# Patient Record
Sex: Female | Born: 1980 | Race: White | Hispanic: No | Marital: Married | Smoking: Never smoker
Health system: Southern US, Community
[De-identification: ages and names within clinical notes are randomized; demographics above are authoritative.]

## PROBLEM LIST (undated history)

## (undated) DIAGNOSIS — C801 Malignant (primary) neoplasm, unspecified: Secondary | ICD-10-CM

## (undated) DIAGNOSIS — R519 Headache, unspecified: Secondary | ICD-10-CM

## (undated) DIAGNOSIS — K219 Gastro-esophageal reflux disease without esophagitis: Secondary | ICD-10-CM

## (undated) DIAGNOSIS — D649 Anemia, unspecified: Secondary | ICD-10-CM

## (undated) HISTORY — PX: THYROIDECTOMY: SHX17

---
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
## 2001-01-11 ENCOUNTER — Other Ambulatory Visit: Admission: RE | Admit: 2001-01-11 | Discharge: 2001-01-11 | Payer: Self-pay | Admitting: Obstetrics and Gynecology

## 2002-01-22 ENCOUNTER — Other Ambulatory Visit: Admission: RE | Admit: 2002-01-22 | Discharge: 2002-01-22 | Payer: Self-pay | Admitting: Obstetrics and Gynecology

## 2003-06-30 ENCOUNTER — Inpatient Hospital Stay (HOSPITAL_COMMUNITY): Admission: AD | Admit: 2003-06-30 | Discharge: 2003-07-03 | Payer: Self-pay | Admitting: Obstetrics and Gynecology

## 2003-08-04 ENCOUNTER — Other Ambulatory Visit: Admission: RE | Admit: 2003-08-04 | Discharge: 2003-08-04 | Payer: Self-pay | Admitting: Obstetrics and Gynecology

## 2004-08-05 ENCOUNTER — Other Ambulatory Visit: Admission: RE | Admit: 2004-08-05 | Discharge: 2004-08-05 | Payer: Self-pay | Admitting: Obstetrics and Gynecology

## 2005-04-29 ENCOUNTER — Encounter (INDEPENDENT_AMBULATORY_CARE_PROVIDER_SITE_OTHER): Payer: Self-pay | Admitting: *Deleted

## 2005-04-29 ENCOUNTER — Inpatient Hospital Stay (HOSPITAL_COMMUNITY): Admission: AD | Admit: 2005-04-29 | Discharge: 2005-05-03 | Payer: Self-pay | Admitting: Obstetrics & Gynecology

## 2006-12-13 ENCOUNTER — Inpatient Hospital Stay (HOSPITAL_COMMUNITY): Admission: AD | Admit: 2006-12-13 | Discharge: 2006-12-13 | Payer: Self-pay | Admitting: Obstetrics and Gynecology

## 2006-12-14 ENCOUNTER — Other Ambulatory Visit: Payer: Self-pay | Admitting: Obstetrics and Gynecology

## 2006-12-26 ENCOUNTER — Encounter (INDEPENDENT_AMBULATORY_CARE_PROVIDER_SITE_OTHER): Payer: Self-pay | Admitting: Obstetrics and Gynecology

## 2006-12-26 ENCOUNTER — Inpatient Hospital Stay (HOSPITAL_COMMUNITY): Admission: AD | Admit: 2006-12-26 | Discharge: 2006-12-30 | Payer: Self-pay | Admitting: Obstetrics and Gynecology

## 2009-05-30 ENCOUNTER — Ambulatory Visit: Payer: Self-pay | Admitting: Internal Medicine

## 2009-10-21 ENCOUNTER — Encounter: Admission: RE | Admit: 2009-10-21 | Discharge: 2009-10-22 | Payer: Self-pay | Admitting: Obstetrics and Gynecology

## 2009-12-17 ENCOUNTER — Inpatient Hospital Stay (HOSPITAL_COMMUNITY): Admission: AD | Admit: 2009-12-17 | Discharge: 2009-12-17 | Payer: Self-pay | Admitting: Obstetrics and Gynecology

## 2009-12-31 ENCOUNTER — Inpatient Hospital Stay (HOSPITAL_COMMUNITY): Admission: RE | Admit: 2009-12-31 | Discharge: 2010-01-03 | Payer: Self-pay | Admitting: Obstetrics and Gynecology

## 2009-12-31 ENCOUNTER — Encounter (INDEPENDENT_AMBULATORY_CARE_PROVIDER_SITE_OTHER): Payer: Self-pay | Admitting: Obstetrics and Gynecology

## 2010-03-17 ENCOUNTER — Emergency Department: Payer: Self-pay | Admitting: Emergency Medicine

## 2010-07-15 LAB — CBC
HCT: 26.9 % — ABNORMAL LOW (ref 36.0–46.0)
HCT: 37.8 % (ref 36.0–46.0)
MCH: 33.4 pg (ref 26.0–34.0)
MCHC: 34.7 g/dL (ref 30.0–36.0)
MCV: 96.1 fL (ref 78.0–100.0)
Platelets: 198 10*3/uL (ref 150–400)
RDW: 13.9 % (ref 11.5–15.5)
RDW: 14.3 % (ref 11.5–15.5)
WBC: 7.2 10*3/uL (ref 4.0–10.5)

## 2010-07-15 LAB — URINALYSIS, ROUTINE W REFLEX MICROSCOPIC
Hgb urine dipstick: NEGATIVE
Nitrite: NEGATIVE
Protein, ur: NEGATIVE mg/dL
Specific Gravity, Urine: >1.03 — ABNORMAL HIGH (ref 1.005–1.030)
Urobilinogen, UA: 1 mg/dL (ref 0.0–1.0)
pH: 6 (ref 5.0–8.0)

## 2010-07-15 LAB — GLUCOSE, CAPILLARY
Glucose-Capillary: 77 mg/dL (ref 70–99)
Glucose-Capillary: 84 mg/dL (ref 70–99)

## 2010-07-15 LAB — ABO/RH: ABO/RH(D): O POS

## 2010-07-15 LAB — SURGICAL PCR SCREEN: Staphylococcus aureus: NEGATIVE

## 2010-07-15 LAB — TYPE AND SCREEN

## 2010-07-15 LAB — RPR: RPR Ser Ql: NONREACTIVE

## 2010-09-14 NOTE — Op Note (Signed)
Melanie [name], Melanie [name]               ACCOUNT NO.:  [id number]   MEDICAL RECORD NO.:  [id number]          PATIENT TYPE:  INP   LOCATION:  9373                          FACILITY:  [facility]   PHYSICIAN:  Randye Lobo, M.D.   DATE OF BIRTH:  May 23, 1980   DATE OF PROCEDURE:  12/26/2006  DATE OF DISCHARGE:                               OPERATIVE REPORT   PREOPERATIVE DIAGNOSIS:  1. Intrauterine gestation at 36 plus 2 weeks.  2. Pregnancy induced hypertenstion, possible developing preeclampsia.  3. History of prior cesarean section, desires repeat cesarean section.   POSTOPERATIVE DIAGNOSIS:  1. Intrauterine gestation at 36 plus 2 weeks.  2. Pregnancy induced hypertension, possible developing preeclampsia.  3. Fetal macrosomia.  4. History of cesarean section, desires repeat cesarean section.   PROCEDURE:  Repeat low segment transverse cesarean section.   SURGEON:  Randye Lobo, M.D.   ASSISTANT:  Luvenia Redden, M.D.   ANESTHESIA:  Spinal.   IV FLUIDS:  2600 mL Ringer's lactate.   ESTIMATED BLOOD LOSS:  800 mL.   URINE OUTPUT:  450 mL.   COMPLICATIONS:  None.   INDICATIONS FOR PROCEDURE:  The patient is a 30 year old gravida 3, para  1-1-0-2, Caucasian female at 42 plus [redacted] weeks gestation by a 9 week  ultrasound, who presented to the maternity admissions area on December 26, 2006, with uterine contractions.  At the time, the patient was noted to  have an elevated blood pressure.  The patient's cervix was fingertip  dilated, long and thick.  The fetal heart rate tracing was reactive and  there were irritable contractions which were noted.  The patient did  have pre-eclamptic laboratory studies which were normal.   Of note, approximately ten days prior, the patient presented to the  office for routine antepartum visit and was noted to have elevated blood  pressure in the 100 to 150 range over the 90s.  At that time, the  patient did have evaluation of the elevated blood pressure  in the  maternity admissions unit and she was noted to have normal laboratory  serum studies and a reassuring fetal assessment.  A 24-hour urine  collection was begun and the patient was discharged to home and placed  on bedrest.  The patient had blood pressure which returned to normal as  an outpatient.  Her final 24-hour urine collection did return  documenting 150 mg of protein.  The proteinuria was not apparent on the  follow-up office visit.   The patient does have a history of a prior cesarean section for  pregnancy induced hypertension at [redacted] weeks along with a transverse lie.  She also has a history of prior shoulder dystocia with a vaginal  delivery.  The patient is scheduled for a repeat cesarean section on  September 12.   The patient was observed in the maternity admissions area.  Her blood  pressure improved significantly with her on bedrest but as she would  ambulate to use the restroom, the blood pressure was noted to increase.  The patient had last had something to drink at approximately  midnight.  A plan was, therefore, made to proceed with a repeat cesarean section in  the morning as the mother and fetus were stable.  A plan is made now to  proceed with a repeat low segment transverse cesarean section after  risks, benefits, and alternatives have been discussed.  The patient  understands that with a preterm delivery, there is a possible admission  of the newborn to the neonatal intensive care unit.  The patient and  husband choose to proceed.   FINDINGS:  A viable female was delivered at 11:01 a.m. with Apgars of 8  at 1 minute and 9 at 5 minutes.  The amniotic fluid was noted to be  clear.  The weight of the newborn is later noted to be 8 pounds 10  ounces.  The placenta is noted to be large and has a normal insertion of  a three vessel cord.  The uterus, tubes and ovaries were unremarkable.  There is no evidence of any adhesive disease noted in the pelvis.  The   newborn is requiring blow by O2 and is, therefore, being transferred to  the neonatal intensive care nursery.   DESCRIPTION OF PROCEDURE:  The patient was reidentified in the maternity  admissions area.  She was brought down to the operating room where a  spinal anesthetic was administered.  The patient did receive clindamycin  900 mg IV for antibiotic prophylaxis.  In the operating room, a spinal  anesthetic was administered.  The patient was placed in the left lateral  supine position.  The abdomen was sterilely prepped and a Foley catheter  was sterilely placed inside the bladder.  She was sterilely draped.   A Pfannenstiel incision was created sharply with a scalpel.  The  incision was carried down to the fascia using monopolar cautery for  hemostasis.  The fascia was incised in the midline and the incision was  extended bilaterally with Mayo scissors.  The rectus muscles were  dissected off of the fascia superiorly and inferiorly.  The rectus  muscles were then sharply divided in the midline.  The parietal  peritoneum was elevated with two hemostat clamps and was entered  sharply.  The peritoneal incision was extended cranially and caudally.   The lower uterine segment was exposed with the bladder retractor.  A  bladder flap was then sharply created.  A transverse lower uterine  segment incision was created sharply with a scalpel.  The uterine  incision was extended bilaterally in an upward fashion with bandage  scissors.  A hand was inserted through the uterine incision and the  vertex was delivered without difficulty followed by the remainder of the  newborn.  The nares and mouth were suctioned.  The cord was doubly  clamped and cut, and the newborn was carried over to the waiting  pediatricians.  The placenta was manually extracted and was sent for  cord blood donation and then sent to pathology for evaluation.   The uterus was exteriorized.  Remaining membranes were removed  from  within the uterine cavity using a ring forceps.  The uterus was then  wiped clean and no remaining products of conception were noted.  The  uterine incision was closed with a double layer closure of #1 chromic.  The first layer was a running locked layer and the second was an  imbricating layer.  The uterus was returned to the peritoneal cavity at  this time which was irrigated and suctioned.  There was a  small amount  of bleeding noted in the mid portion of the uterine incision and this  was sutured with a figure-of-eight suture of 3-0 Vicryl to create good  hemostasis.   The abdomen was closed at this time.  The parietal peritoneum was closed  with a running suture of 3-0 Vicryl.  The rectus muscles were  reapproximated in the midline with interrupted sutures of #1 chromic.  The fascia was closed with a running suture of 0 Vicryl.  The  subcutaneous layer was irrigated and suctioned and made hemostatic with  monopolar cautery.  The skin was closed with staples.  A sterile bandage  was placed over this.  This concluded the patient's procedure.  There  were no complications.  All needle, instrument, and sponge counts were  correct.   DISPOSITION:  The patient is escorted to the recovery room in stable and  awake condition.  Due to elevated blood pressure immediately prior to  the cesarean section and during the procedure, the patient will be  admitted to the adult intensive care unit for magnesium sulfate seizure  prophylaxis postpartum.      Randye Lobo, M.D.  Electronically Signed     BES/MEDQ  D:  12/26/2006  T:  12/26/2006  Job:  [id number]

## 2010-09-17 NOTE — Op Note (Signed)
[name], [name]               ACCOUNT NO.:  [id number]   MEDICAL RECORD NO.:  [id number]          PATIENT TYPE:  INP   LOCATION:  9113                          FACILITY:  [facility]   PHYSICIAN:  Gerrit Friends. Aldona Bar, M.D.   DATE OF BIRTH:  03-19-1981   DATE OF PROCEDURE:  04/29/2005  DATE OF DISCHARGE:                                 OPERATIVE REPORT   PREOPERATIVE DIAGNOSES:  A 35 to 36-week intrauterine pregnancy,  preeclampsia, transverse lie, size much greater than dates.   POSTOPERATIVE DIAGNOSES:  A 35 to 36-week intrauterine pregnancy,  preeclampsia, transverse lie, size much greater than dates.  Delivery of 8  pounds 13 ounces female infant, Apgars 9 and 9.   PROCEDURE:  Primary low transverse cesarean section.   SURGEON:  Dr. Aldona Bar   ANESTHESIA:  Spinal, Dr. Pamalee Leyden.   HISTORY:  This 30 year old gravida 2, para 1 was seen by me in the office 2  days ago and was doing well at about 35-1/2 weeks' gestation.  It was  realized about 3-4 weeks ago that size was much greater than dates, and an  ultrasound done in our office December 12 showed the baby was at least 7-1/2  pounds at that point in time - 33-[redacted] weeks gestation.  As mentioned, when I  saw her in the office on December 27, she was stable.  She called the office  on the morning of December 29, reporting increased pressure and just was not  feeling well.  Evaluation in the office found her blood pressure to be  elevated.  She was swollen.  She had gained 4 pounds in the past 2 days, and  she was sent to maternity admissions for further evaluation.  In maternity  admissions, size was consistent with at least a term size infant.  Estimated  fetal weight was in my way of thinking in excess of 8 pounds.  The baby was  presenting to transverse lie.  Her blood pressure was 150/100.  All her labs  were within normal limits except for a uric acid of 7.8 and on pelvic  examination, her cervix was fingertip, 20%, soft, but the  presenting part  was totally out the pelvis.  She also had 3+ hyperreflexia and 1 beat of  clonus bilaterally.   It was felt that she had preeclampsia and delivery was most likely  advisable, especially considering the size of the baby.  It is of note that  with the patient's first delivery at 37 weeks for which she was induced  because of preeclampsia, she delivered an 8 pound 10 ounce baby vaginally  after induction, but a shoulder dystocia was encountered at the time of  delivery, fortunately with no sequelae.   Because of the patient's transverse lie and her primary diagnosis of  preeclampsia, the decision was made to proceed with primary low transverse  cesarean section which was discussed in detail with the patient and her  husband.   PROCEDURE:  The patient was taken to the operating where after satisfactory  induction of spinal anesthetic, she was prepped and draped in  the usual  fashion, having been placed in the supine position and slightly tilted to  the left.  A Foley catheter was inserted as part of the prep.   Once the patient was adequately draped and good anesthetic levels were  documented, operative procedure was begun.  A Pfannenstiel incision was made  and without difficulty dissected down sharply to and through the fascia in a  low transverse fashion with hemostasis created at each layer.  The  subfascial space was created inferiorly and superiorly, muscles separated in  the midline, peritoneum identified and entered appropriately with care taken  to avoid the bowel superiorly and the bladder inferiorly.  At this time, the  bladder blade was placed, and the vesicouterine peritoneum was incised in a  low transverse fashion and pushed off the lower uterine segment with ease.  Baby was still presenting to transverse lie.   At this time, a low transverse incision was made in the uterus with  Metzenbaum scissors and extended laterally with the bandage scissors.   Amniotomy was produced with reduction of clear fluid.  At this time, it was  ascertained the baby was a backup transverse lie with the head in the  patient's right lower quadrant and with minimal difficulty the head was  rotated into the pelvis and the baby was delivered as a vertex.  The infant  cried spontaneously once and after the cord was clamped and cut, the infant  was passed off to the awaiting team and subsequently taken to the nursery in  good condition.  Ultimate weight was noted to be 8 pounds 13 ounces.  Baby  was female, and Apgars were 9 and 9.   After the cord bloods were collected, the placenta was delivered intact.  The placenta was sent to pathology appropriately labeled.   The uterus at this time was exteriorized, rendered free of any remaining  products conception.  A small submucosal myoma was removed from  the left  lateral side of the uterus with blunt and sharp dissection.  It measured  approximately 2 cm.   With good uterine contractility afforded with slowly given intravenous  Pitocin and manual stimulation, the uterine incision was closed using a  layer of #1 Vicryl in a running locking fashion followed by a layer of #1  Vicryl in an imbricating fashion.  The bladder flap was not reapproximated.  The uterine incision was dry.   Tubes and ovaries appeared normal.  Uterus was well contracted and at this  time, after the abdomen was lavaged of all free blood and clot, the uterus  was re-placed into the abdominal cavity and after all counts were noted to  be correct and no foreign bodies were noted to be remaining in the abdominal  cavity, closure of the abdomen was begun in layers.  The abdominal  peritoneum was closed with 0 Vicryl in a running fashion and muscle secured  with same.  Subfascial space was rendered hemostatic, and the fascia was  reapproximated with 0 Vicryl from angle to midline bilaterally.  The subcutaneous tissues were rendered hemostatic,  and staples were then used to  close the skin.  A sterile pressure dressing was applied.  The patient good  urine output during the procedure, at the conclusion of the procedure was  stable.  A sterile pressure dressing was applied, and she was taken to the  recovery room in satisfactory condition, having tolerated the procedure  well.  At conclusion of the procedure, both mother  and baby were doing well  in their respective recovery areas.   In summary, this patient presented at 51-36 weeks' gestation with  preeclampsia and a transverse lie and size much greater than dates.  Decision was made to proceed with delivery by primary low transverse  cesarean section primarily because of the patient's transverse lie, and she  was delivered of an 8 pound 13 ounce female infant with Apgars of 9 and 9 -  she was only 35-36 weeks' gestation.  Condition on arrival to recovery is  satisfactory.  All counts correct x2.      Gerrit Friends. Aldona Bar, M.D.  Electronically Signed     RMW/MEDQ  D:  04/29/2005  T:  04/30/2005  Job:  [id number]

## 2010-09-17 NOTE — Discharge Summary (Signed)
[name], Melanie [name]               ACCOUNT NO.:  [id number]   MEDICAL RECORD NO.:  [id number]          PATIENT TYPE:  INP   LOCATION:  9113                          FACILITY:  [facility]   PHYSICIAN:  Ilda Mori, M.D.   DATE OF BIRTH:  11/06/1980   DATE OF ADMISSION:  04/29/2005  DATE OF DISCHARGE:  05/03/2005                                 DISCHARGE SUMMARY   FINAL DIAGNOSES:  1.  Intrauterine gestation at 35-36 weeks.  2.  Preeclampsia.  3.  Transverse lie.  4.  Size much greater than dates.   PROCEDURE:  Primary low transverse cesarean section.  Surgeon:  Gerrit Friends.  Aldona Bar, M.D.  Complications:  None.   This 30 year old G2, P35, was seen in the office on December 29 complaining  of increased pressure and is not feeling well.  Evaluation in the office  found her blood pressure to be elevated.  She gained four pounds in the last  two days and then was sent to maternity admissions for further evaluation.  The patient's course was noticed to be about the size greater than dates  within the last couple of weeks, and an ultrasound showed that the baby was  probably about 7-1/2 pounds at this point.  The patient's antepartum course  up to this point had been uncomplicated.  She passed her one-hour glucose  tolerance test and did not start to develop these elevated blood pressures  until this admission.  The baby presented in a transverse lie.  Blood  pressure was up to about 150/100 on admission.  All her labs were within  normal limits except for uric acid of 7.8, and was just still fingertip  dilated, only 20% effaced.  She also had 3+ hyperreflexia and one beat of  clonus bilaterally.  At this point, because of the preeclampsia and being  far from delivery and size greater than dates, a decision was made to  proceed with cesarean section.  The patient was taken to the operating room  on April 29, 2005, by Dr. Annamaria Helling, where a primary low transverse  cesarean section was  performed with the delivery of an 8 pound 13 ounce  female infant with Apgars of 9 and 9.  Delivery went without complications.  The patient's postoperative course was uncomplicated.  Her blood pressures  started going down postoperatively and she was felt ready for discharge on  postoperative day #4.  On postoperative day #4 she was ready for discharge  on the first but was in the hospital with infant secondary sent to some  neonatal jaundice, and therefore is being discharged on the second.  She was  sent home on a regular diet, told to decrease activities, told to continue  her prenatal vitamins, was given Tylox one to two ever four hours as needed  for pain.  Told she could use an over-the-counter ibuprofen up to 600 mg  every six hours as needed for pain.  Was to follow up in the office in four  weeks.   DISCHARGE LABORATORY DATA:  Hemoglobin of 9.5, white blood cell count of  10.1, and platelets of 202,000.      Leilani Able, P.A.-C.      Ilda Mori, M.D.  Electronically Signed    MB/MEDQ  D:  05/19/2005  T:  05/19/2005  Job:  [id number]

## 2010-09-17 NOTE — Discharge Summary (Signed)
Melanie [name], Melanie [name]               ACCOUNT NO.:  [id number]   MEDICAL RECORD NO.:  [id number]          PATIENT TYPE:  INP   LOCATION:  9319                          FACILITY:  [facility]   PHYSICIAN:  Carrington Clamp, M.D. DATE OF BIRTH:  January 08, 1981   DATE OF ADMISSION:  12/26/2006  DATE OF DISCHARGE:  12/30/2006                               DISCHARGE SUMMARY   FINAL DIAGNOSES:  1. Intrauterine pregnancy at 62 and two-sevenths weeks gestation.  2. Preeclampsia.  3. History of prior cesarean section.  The patient desires repeat      cesarean section.  4. Fetal macrosomia.   PROCEDURE:  Repeat low transverse cesarean section.  Surgeon:  Dr. Conley Simmonds.  Assistant:  Dr. Lodema Hong.  Complications:  None.   This 30 year old G3, P1-1-0-2 presents at 30 and two-sevenths weeks  gestation with uterine contractions.  Upon this time the patient was  noted to have some elevated blood pressures.  She was only a fingertip  dilated and the cervix was still long and thick.  Fetal heart rate  tracing was nice and reactive and the patient did have some routine pre-  eclamptic labs which came back normal.  The patient's antepartum course  up to this point had been complicated by a history of pregnancy-induced  hypertension with her past two pregnancies.  Also history of cesarean  section secondary to transverse presentation with her first pregnancy.  Otherwise, had been uncomplicated.  She did have a negative group B  strep culture obtained in our office at [redacted] weeks gestation.   As far as the patient's hospital course goes, the patient has had a  history of cesarean section and desires repeat cesarean section with  this pregnancy.  The patient was observed in the maternity admissions  area.  Blood pressure improved on bedrest, but when she ambulated it did  increase.  Therefore, discussion was held with the patient and decision  was made to proceed with a cesarean section at this point.  The  patient  was taken to the operating room on December 26, 2006, by Dr. Conley Simmonds  where a repeat low segment transverse cesarean section was performed  with the delivery of an 8-pound 10-ounce female infant with Apgars of 8  and 9.  Delivery went without complications.  The patient's  postoperative course was benign without any significant fevers.  She was  continued on magnesium sulfate for a total of 24 hours for prophylaxis.  Baby was in the NICU but stable.  The patient continued to do well and  her blood pressure started normalizing postoperatively.  By  postoperative day #3 the patient's blood pressures were still elevated  and she was started on some labetalol 100 mg twice daily.  She was kept  in-house for one more day just to watch her pressures, and was stable on  labetalol and felt ready for discharge.  She was sent home on a regular  diet, told to decrease activities, told to continue her prenatal  vitamins and her labetalol 100 mg twice daily.  She was given Percocet  one  to two every 4-6 hours as needed for her pain, told she could use  over-the-counter ibuprofen up to 600 mg every 6 hours as needed for  pain, was to follow up in our office in 1 week for a blood pressure  check.  Precautions and instructions were reviewed with the patient.   LABORATORIES ON DISCHARGE:  The patient had a hemoglobin of 10.6; white  blood cell count of 9.7; platelets of 159,000.      Leilani Able, P.A.-C.      Carrington Clamp, M.D.  Electronically Signed    MB/MEDQ  D:  02/09/2007  T:  02/10/2007  Job:  [id number]

## 2011-02-09 ENCOUNTER — Ambulatory Visit: Payer: Self-pay | Admitting: Unknown Physician Specialty

## 2011-02-11 LAB — COMPREHENSIVE METABOLIC PANEL
ALT: 11
AST: 22
Alkaline Phosphatase: 115
BUN: 4 — ABNORMAL LOW
Calcium: 7.4 — ABNORMAL LOW
Chloride: 107
Creatinine, Ser: 0.64
Creatinine, Ser: 0.77
Glucose, Bld: 92
Potassium: 3.9
Potassium: 4.6
Sodium: 137
Total Bilirubin: 0.3
Total Protein: 5.9 — ABNORMAL LOW

## 2011-02-11 LAB — URINALYSIS, ROUTINE W REFLEX MICROSCOPIC
Hgb urine dipstick: NEGATIVE
Protein, ur: NEGATIVE
Specific Gravity, Urine: 1.015
pH: 5.5

## 2011-02-11 LAB — CBC
HCT: 28.8 — ABNORMAL LOW
HCT: 30.4 — ABNORMAL LOW
Hemoglobin: 10.6 — ABNORMAL LOW
Hemoglobin: 11.1 — ABNORMAL LOW
Hemoglobin: 9.9 — ABNORMAL LOW
MCHC: 34.8
MCV: 85.9
Platelets: 158
RBC: 3.36 — ABNORMAL LOW
RBC: 3.55 — ABNORMAL LOW
RDW: 14.4 — ABNORMAL HIGH
WBC: 7.8
WBC: 9.1
WBC: 9.7

## 2011-02-11 LAB — URIC ACID
Uric Acid, Serum: 6.5
Uric Acid, Serum: 7

## 2011-02-11 LAB — LACTATE DEHYDROGENASE: LDH: 231

## 2011-02-11 LAB — MAGNESIUM: Magnesium: 4.8 — ABNORMAL HIGH

## 2011-02-14 LAB — COMPREHENSIVE METABOLIC PANEL
ALT: 10
AST: 18
CO2: 21
Calcium: 9
GFR calc Af Amer: >60
Sodium: 137
Total Protein: 5.7 — ABNORMAL LOW

## 2011-02-14 LAB — PROTEIN, URINE, 24 HOUR
Collection Interval-UPROT: 24
Protein, 24H Urine: 150 — ABNORMAL HIGH
Protein, Urine: 6

## 2011-02-14 LAB — CBC
MCHC: 34.3
RBC: 3.79 — ABNORMAL LOW
RDW: 13.7

## 2011-02-14 LAB — CREATININE CLEARANCE, URINE, 24 HOUR
Collection Interval-CRCL: 24
Urine Total Volume-CRCL: 2500

## 2011-04-05 ENCOUNTER — Ambulatory Visit: Payer: Self-pay | Admitting: Unknown Physician Specialty

## 2011-04-06 LAB — PATHOLOGY REPORT

## 2011-09-12 ENCOUNTER — Ambulatory Visit: Payer: Self-pay | Admitting: Unknown Physician Specialty

## 2011-09-20 ENCOUNTER — Ambulatory Visit: Payer: Self-pay | Admitting: Unknown Physician Specialty

## 2012-01-19 ENCOUNTER — Other Ambulatory Visit: Payer: Self-pay

## 2013-01-24 ENCOUNTER — Other Ambulatory Visit: Payer: Self-pay

## 2013-07-17 ENCOUNTER — Ambulatory Visit: Payer: Self-pay | Admitting: Physician Assistant

## 2013-07-26 ENCOUNTER — Ambulatory Visit: Payer: Self-pay | Admitting: Physician Assistant

## 2013-08-05 ENCOUNTER — Ambulatory Visit: Payer: Self-pay | Admitting: Oncology

## 2013-08-13 ENCOUNTER — Ambulatory Visit: Payer: Self-pay | Admitting: Unknown Physician Specialty

## 2013-08-13 LAB — CALCIUM: CALCIUM: 8.8 mg/dL (ref 8.5–10.1)

## 2013-08-14 LAB — CALCIUM
CALCIUM: 7.5 mg/dL — AB (ref 8.5–10.1)
CALCIUM: 7.3 mg/dL — AB (ref 8.5–10.1)
Calcium, Total: 7.4 mg/dL — ABNORMAL LOW (ref 8.5–10.1)

## 2013-08-15 LAB — CALCIUM
CALCIUM: 6.8 mg/dL — AB (ref 8.5–10.1)
Calcium, Total: 7.4 mg/dL — ABNORMAL LOW (ref 8.5–10.1)
Calcium, Total: 7.3 mg/dL — ABNORMAL LOW (ref 8.5–10.1)

## 2013-08-15 LAB — MAGNESIUM
MAGNESIUM: 1.8 mg/dL
Magnesium: 1.6 mg/dL — ABNORMAL LOW

## 2013-08-15 LAB — PATHOLOGY REPORT

## 2013-08-20 LAB — TSH: Thyroid Stimulating Horm: 31.9 u[IU]/mL — ABNORMAL HIGH

## 2013-08-20 LAB — CBC CANCER CENTER
BASOS PCT: 1.3 %
Basophil #: 0.1 x10 3/mm (ref 0.0–0.1)
Eosinophil #: 0.7 x10 3/mm (ref 0.0–0.7)
Eosinophil %: 9.3 %
HCT: 38 % (ref 35.0–47.0)
HGB: 12.8 g/dL (ref 12.0–16.0)
Lymphocyte #: 2.6 x10 3/mm (ref 1.0–3.6)
Lymphocyte %: 32.4 %
MCH: 29.6 pg (ref 26.0–34.0)
MCHC: 33.7 g/dL (ref 32.0–36.0)
MCV: 88 fL (ref 80–100)
Monocyte #: 0.6 x10 3/mm (ref 0.2–0.9)
Monocyte %: 7.3 %
NEUTROS ABS: 4 x10 3/mm (ref 1.4–6.5)
Neutrophil %: 49.7 %
PLATELETS: 341 x10 3/mm (ref 150–440)
RBC: 4.33 10*6/uL (ref 3.80–5.20)
RDW: 13.2 % (ref 11.5–14.5)
WBC: 8 x10 3/mm (ref 3.6–11.0)

## 2013-08-30 ENCOUNTER — Ambulatory Visit: Payer: Self-pay | Admitting: Oncology

## 2013-09-02 LAB — TSH

## 2013-09-03 LAB — HCG, QUANTITATIVE, PREGNANCY: Beta Hcg, Quant.: <1 m[IU]/mL — ABNORMAL LOW

## 2013-09-30 ENCOUNTER — Ambulatory Visit: Payer: Self-pay | Admitting: Oncology

## 2014-01-29 ENCOUNTER — Other Ambulatory Visit: Payer: Self-pay

## 2014-01-30 LAB — CYTOLOGY - PAP

## 2014-08-18 ENCOUNTER — Ambulatory Visit
Admit: 2014-08-18 | Disposition: A | Discharge status: Home / Self Care | Payer: Self-pay | Attending: Internal Medicine | Admitting: Internal Medicine

## 2014-08-23 NOTE — Consult Note (Signed)
Allergies:  Sulfa drugs: Hives  Keflex: Hives  Penicillin: Hives  Tape: Rash  Assessment/Plan:  Assessment/Plan 34 yo F with papillary thyroid carcinoma s/p tota thyroidectomy on 4/14 with post-op hypocalcemia. Calcium in the 6.8 - 7.4 range post-op despite bid calcium+D. She was symptomatic overnight with extremity parethesias and was treated with 2g IV calcium gluconate.  She was interviewed, examined, and chart was reviewed.  A/ 1. 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 This may be temproary as there were no parathyroid glands appreciated on pathology. 2. Multifocal papillary thyroid carcinoma 3. Post-op hypothyroidism  P/ -Add calcitriol 0.5 mcg daily -Continue with current doses of calcium with D and magnesium -Cautioned her about s/s of hypocalcemia and appropriate treatment -Suspect she is safe for discharge per Dr. Tami Ribas. I will have her rtn for labs on Monday 4/20 including chem panel and magnesium and will then adjust her supplements and continue to follow, as needed.  Full consult has been dictated.   Case Discussed With patient, family   Electronic Signatures: Judi Cong (MD)  (Signed 16-Apr-15 14:02)  Authored: [facility], Assessment/Plan   Last Updated: 16-Apr-15 14:02 by Judi Cong (MD)

## 2014-08-23 NOTE — Op Note (Signed)
PATIENT NAME:  Melanie [name], ATKIN MR#:  [id number] DATE OF BIRTH:  10-May-1980  DATE OF PROCEDURE:  08/13/2013  DATE OF DISCHARGE: 08/13/2013   PREOPERATIVE DIAGNOSIS: Papillary carcinoma of the thyroid.  POSTOPERATIVE DIAGNOSIS: Papillary carcinoma of the thyroid.  ATTENDING SURGEON: Roena Malady, MD  ASSISTANT SURGEON: Huey Romans, MD  OPERATION PERFORMED: Total thyroidectomy, limited anterior neck dissection, laryngeal nerve monitoring for 2 hours.   OPERATIVE FINDINGS: Firm mass, left lobe of the thyroid, 1 small lymph node superior and anterior to the gland.   DESCRIPTION OF PROCEDURE: Juliyah was identified in the holding area and taken to the operating room and placed in supine position. After general endotracheal anesthesia with the laryngeal monitoring device on the endotracheal tube, which was visualized, the neck was gently extended. An incision line was marked in a natural skin crease overlying the cricoid cartilage. A local anesthetic of 1% lidocaine with 1:100,000 epinephrine was used to inject along the incision line. A total of 4 mL was used. With the neck prepped and draped sterilely, a 15-blade was used to incise down to and through the platysma muscle. Hemostasis was achieved using the Bovie cautery. Strap muscles were identified in the midline and gently retracted. Beginning on the left-hand side, the strap muscles were retracted laterally, and the left lobe of the gland was identified. This dissection took place over the lateral aspect of the gland, where there were multiple feeding vessels which were divided using the Harmonic scalpel. As the gland was retracted medially, several more feeding vessels were divided using the Harmonic. The superior pole was then isolated and divided using the Harmonic scalpel. As the gland was peeled medially, the superior and inferior parathyroid glands were identified and left intact with their vascular pedicles. The recurrent laryngeal nerve  was identified within the tracheoesophageal groove. This was identified and stimulated and left intact throughout the case. There was a firm, approximately 2 x 3 cm nodule in the left inferior aspect of the lobe which was retracted medially. In the superior aspect of the dissection, there was some fibrofatty tissue that contained at least 1 lymph node we could identify. This was dissected free from the surrounding tissue and sent as a separate specimen. As the gland was retracted medially, the tracheal wall was identified and was peeled over. The Berry's ligament was released, allowing the left lobe of the gland to medialize. With the left lobe dissected, attention was then turned to the right lobe, and in a similar fashion, the strap muscles were retracted laterally. There were multiple small feeding vessels which were divided using the Harmonic scalpel. The superior pole vessels were divided and allowed the superior aspect of the gland to come medially. The superior parathyroid gland was identified, dissected free and left on its vascular pedicle. The gland was retracted medially. The recurrent laryngeal nerve was identified in its groove, which was also identified, stimulated and left intact throughout the case. As the gland was retracted medially, the inferior parathyroid gland was identified and left on its vascular pedicle as the gland was retracted. Multiple feeding vessels were again divided using the Harmonic scalpel. Berry's ligament was released on the right, and the entire gland was peeled off the tracheal wall. Inspection of both sides of the neck again were dissected for any small lymph nodes. There was no other lymphadenopathy identified. The wound was then copiously irrigated with saline. Any small bleeding points were cauterized using the microbipolar. Surgicel was then placed in the neurovascular bed  at the end. Both nerves were stimulated and were intact at the end of the case. Two #10 TLS drains  were brought out of the wound inferiorly. Strap muscles were reapproximated using 4-0 Vicryl, he platysmal layer was closed using 4-0 Vicryl, and the skin was closed using Dermabond. A Tegaderm was used to hold the drains in place. The patient was then returned to anesthesia, where she was awakened in the operating room and taken to the recovery room in stable condition.   CULTURES: None.   SPECIMENS: Total thyroid and left anterior neck specimen.   ESTIMATED BLOOD LOSS: Less than 20 mL.    ____________________________ Roena Malady, MD ctm:lb D: 08/13/2013 09:17:11 ET T: 08/13/2013 09:31:21 ET JOB#: [id number]  cc: Roena Malady, MD, <Dictator> Roena Malady MD ELECTRONICALLY SIGNED 08/28/2013 8:26

## 2014-08-23 NOTE — Consult Note (Signed)
PATIENT NAME:  Melanie [name], Melanie [name] MR#:  [id number] DATE OF BIRTH:  July 18, 1980  ENDOCRINOLOGY CONSULTATION   DATE OF CONSULTATION:  08/15/2013  REFERRING PHYSICIAN:  Roena Malady, MD CONSULTING PHYSICIAN:  A. Lavone Orn, MD  CHIEF COMPLAINT: Hypocalcemia.   HISTORY OF PRESENT ILLNESS: This is a 34 year old female seen in consultation at the request of Dr. Beverly Gust for hypocalcemia. The patient has papillary thyroid cancer and underwent a total thyroidectomy on 08/13/2013. Pathology report indicates multifocal papillary thyroid carcinoma with 3 foci of tumor localized to the left lobe, measuring 17 mm, 5 mm and 2 mm. Background of Hashimoto's thyroiditis and nodular hyperplasia was present. One perithyroidal lymph node was negative. No parathyroid glands were seen. On postoperative day 1, her postoperative calcium level was 7.5, and repeat levels later that afternoon were 7.4 and 7.3. Overnight last night, she developed paresthesias in the extremities, and a calcium level at 2:00 a.m. was low at 6.8, and a repeat level today was 7.4. When she was symptomatic early this morning, she was given 2 grams calcium gluconate. On the evening of her operation, she was started on calcium carbonate 500 plus vitamin D 200 units 2 tabs b.i.d., and in addition, early today, when her magnesium was low at 1.7, magnesium oxide 800 mg per day was added. She reports the paresthesias have resolved. She denies any nausea, vomiting or abdominal pain. Appetite is fair. Denies dysphagia. No hoarseness. No cramping of the extremities. Regarding her thyroid cancer, preoperatively, she was seen by Dr. Oliva Bustard, oncology at [facility]. He recommended that she have I-131 ablation. She has not been started on levothyroxine, and as she understands it, the I-131 ablation will be performed sometime within about 4 weeks while she remains off levothyroxine.   PAST MEDICAL HISTORY: Papillary thyroid carcinoma, as per HPI.   PAST SURGICAL  HISTORY: Total thyroidectomy, 08/13/2013.   ALLERGIES: SULFA, KEFLEX AND PENICILLIN, ALL OF WHICH CAUSE HIVES.   FAMILY HISTORY: Positive for prostate cancer, heart disease and diabetes.   SOCIAL HISTORY: No tobacco or alcohol use.   CURRENT MEDICATIONS:  1. Calcium plus vitamin D 500/200 two tabs b.i.d.  2. Magnesium 800 mg daily.  3. Ciprofloxacin 500 mg q.12 hours.  4. Pantoprazole 40 mg daily.   REVIEW OF SYSTEMS:  GENERAL: No weight loss. No fever.  HEENT: Denies blurred vision. Denies sore throat.  NECK: Incisional discomfort has been treated with narcotics with good effect. Denies dysphagia.  CARDIAC: Denies chest pain. Denies palpitation.  PULMONARY: Denies cough. Denies shortness of breath.  ABDOMEN: Reports good appetite. No nausea.  EXTREMITIES: Denies leg swelling.  SKIN: Denies recent skin changes or rash.  HEMATOLOGIC: Denies easy bruisability or recent bleeding. ENDOCRINE: Denies heat or cold intolerance.   LABORATORY: As per HPI.   IMAGING: None available.   ASSESSMENT:  1. 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 As there was no parathyroid gland seen on the pathology report, this is thought to be due to a stunned parathyroid gland from surgery, and we are optimistic it will improve with time.  2. Postsurgical hypothyroidism.  3. Multifocal papillary thyroid carcinoma.   RECOMMENDATIONS:  1. Continue calcium plus vitamin D 2 tabs b.i.d. as ordered.  2. Continue magnesium 800 mg daily.  3. Add calcitriol 0.5 mcg once daily. I will have this given now.  4. Discussed recognition of signs and symptoms of hypo- and hypercalcemia. Advised she buy over-the-counter Tums and take 2 tabs if she ever has paresthesias, perioral or in  the extremities.  5. Plan to check a repeat MET-B Monday, 08/19/2013, at [facility] in [place]. She acknowledged understanding and will go to the [facility] around 8:00 a.m. Monday morning to have that done. I  plan to call her with that lab result and then taper the calcium and/or calcitriol, if needed. Plan to obtain also a magnesium level and taper the magnesium oxide if needed.  6. The patient to follow up with Dr. Oliva Bustard as planned to arrange I-131 ablation.   Thank you for the kind request for consultation.   ____________________________ A. Lavone Orn, MD ams:lb D: 08/15/2013 13:57:41 ET T: 08/15/2013 14:37:29 ET JOB#: [id number]  cc: A. Lavone Orn, MD, <Dictator> Sherlon Handing MD ELECTRONICALLY SIGNED 08/20/2013 17:32

## 2014-08-23 NOTE — Discharge Summary (Signed)
Dates of Admission and Diagnosis:  Date of Admission 13-Aug-2013   Date of Discharge 01-Jan-0001   Admitting Diagnosis Thyroid cancer   Final Diagnosis Same with hypocalcemia    Chief Complaint/History of Present Illness Thyroid Cancer   Allergies:  Sulfa drugs: Hives  Keflex: Hives  Penicillin: Hives  Tape: Rash  Pertinent Past History:  Pertinent Past History Neg   Hospital Course:  Hospital Course Admitted s/p thyroidectomy.  Hypocalcemia stablized.  DC to home on appropriate meds.   Condition on Discharge Good   Code Status:  Code Status Full Code   DISCHARGE INSTRUCTIONS HOME MEDS:  Medication Reconciliation: Patient's Home Medications at Discharge:     Medication Instructions  zyrtec 10 mg oral tablet  1 tab(s) orally once a day, As Needed   pataday 0.2% ophthalmic solution  1 drop(s) to each affected eye once a day, As Needed   calcium 600+d 600 mg-200 units oral tablet  1 tab(s) orally once a day-pt to start 08-06-13   acetaminophen-hydrocodone 325 mg-5 mg oral tablet  1 tab(s) orally every 3 to 4 hours, As needed, pain   calcitriol 0.5 mcg oral capsule  1 cap(s) orally once a day   magnesium oxide 400 mg (241.3 mg elemental magnesium) oral tablet  2 tab(s) orally once a day    STOP TAKING THE FOLLOWING MEDICATION(S):    zantac 150 150 mg oral tablet: 1 tab(s) orally once a day, As Needed anusol-hc 25 mg rectal suppository: 1 suppository(ies) rectal once a day, As Needed retin a micro gel 0.1% topical gel: Apply topically to affected area once a day ibuprofen 200 mg oral tablet: 1 tab(s) orally every 6 hours, As Needed  Physician's Instructions:  Diet Low iodine diet   Activity Limitations As tolerated   Return to Work 2 weeks   Time frame for Follow Up Appointment 1-2 weeks   Electronic Signatures: Roena Malady (MD)  (Signed 16-Apr-15 14:34)  Authored: ADMISSION DATE AND DIAGNOSIS, CHIEF COMPLAINT/HPI, Allergies, PERTINENT PAST HISTORY,  HOSPITAL COURSE, DISCHARGE INSTRUCTIONS HOME MEDS, PATIENT INSTRUCTIONS   Last Updated: 16-Apr-15 14:34 by Roena Malady (MD)

## 2016-01-09 IMAGING — US THYROID ULTRASOUND
1 series · 13 of 25 positions shown · non-contrast
Comparison: None.

CLINICAL DATA: Thyroid nodule

EXAM:
THYROID ULTRASOUND
TECHNIQUE: Ultrasound examination of the thyroid gland and adjacent soft
tissues was performed.

[Series 1: thyroid ultrasound · 0.08mm/px · 13 of 63 slices shown]
[im 1/63]
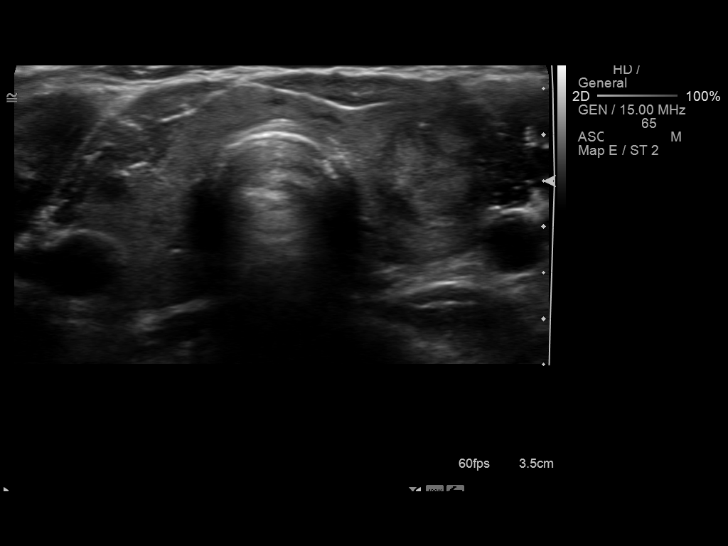
[im 6/63]
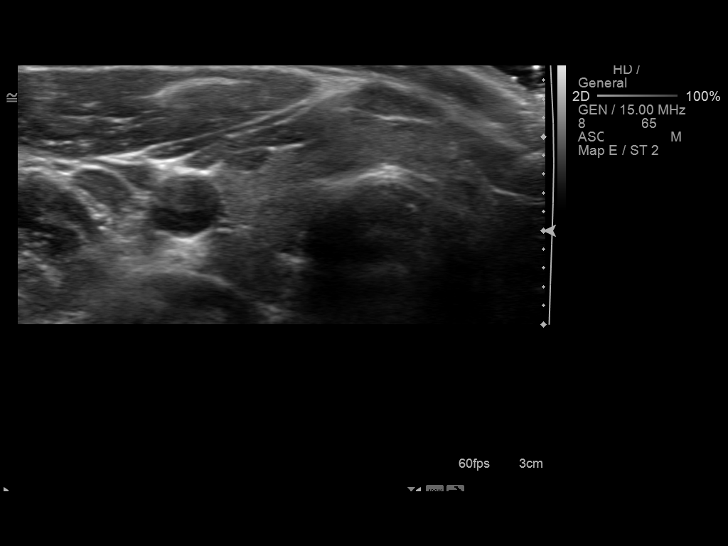
[im 11/63]
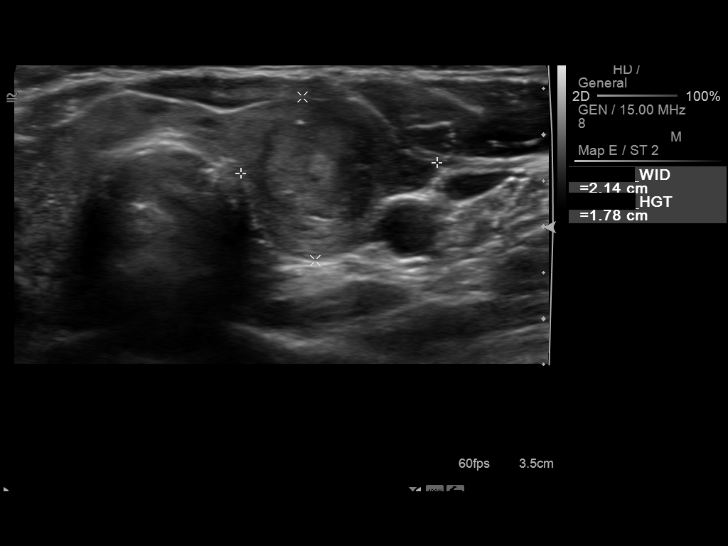
[im 16/63]
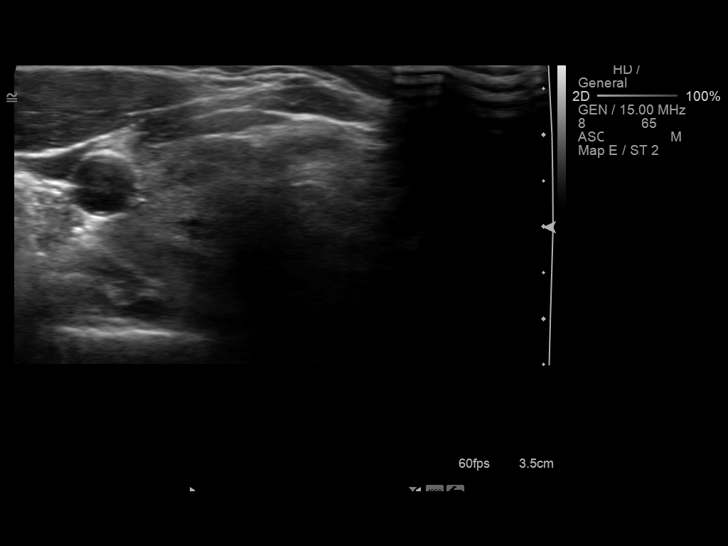
[im 21/63]
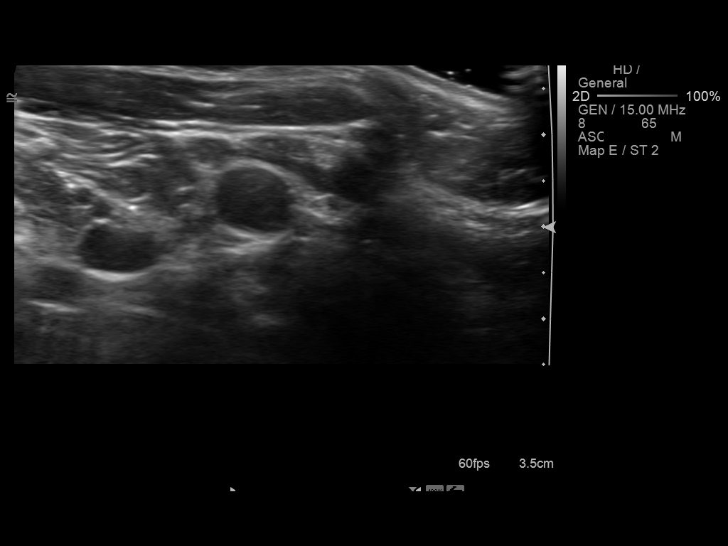
[im 26/63]
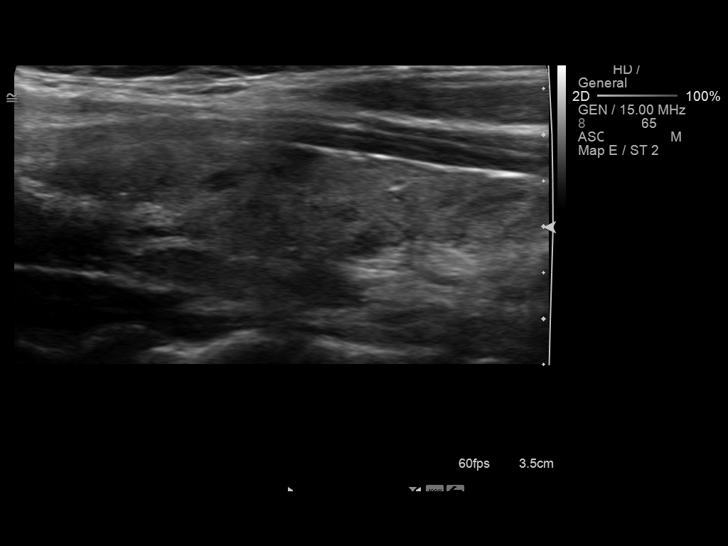
[im 32/63]
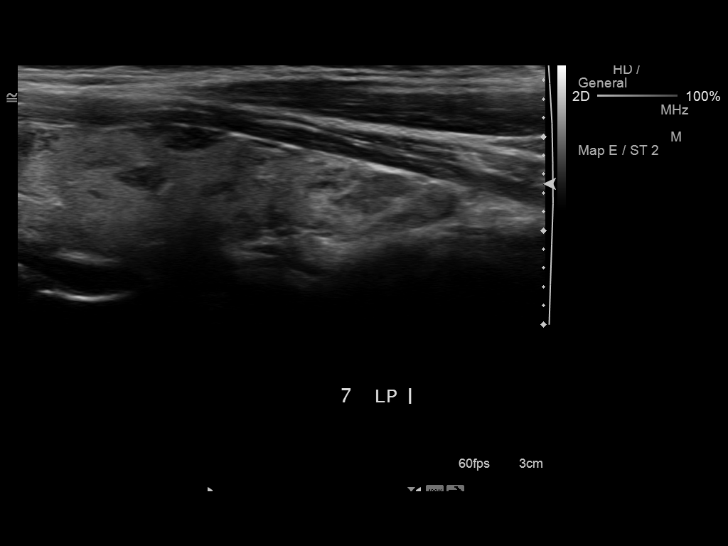
[im 37/63]
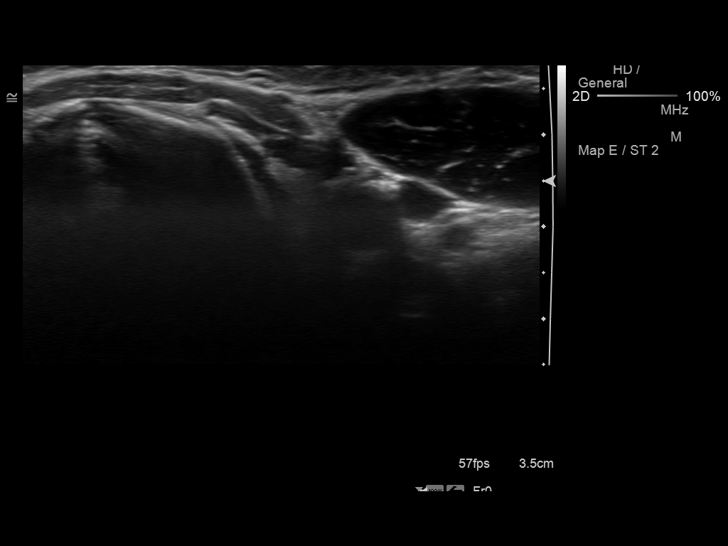
[im 42/63]
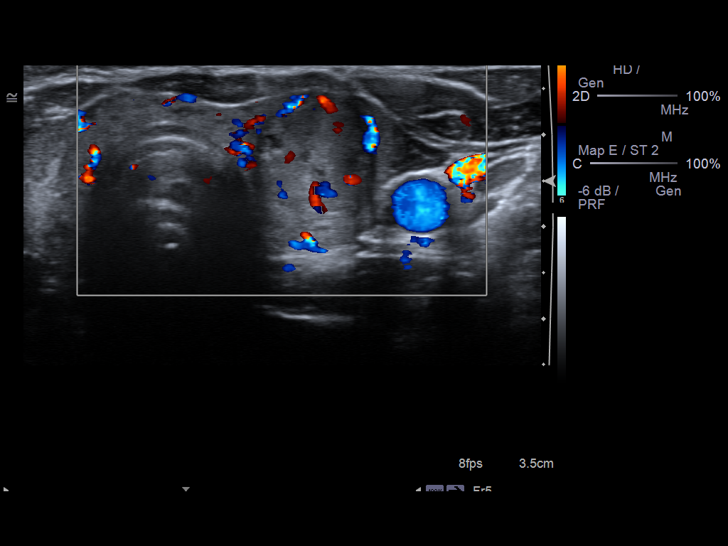
[im 47/63]
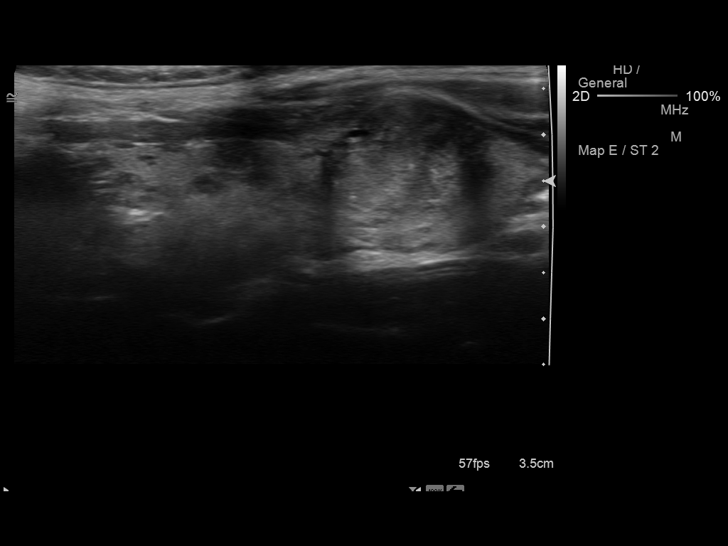
[im 52/63]
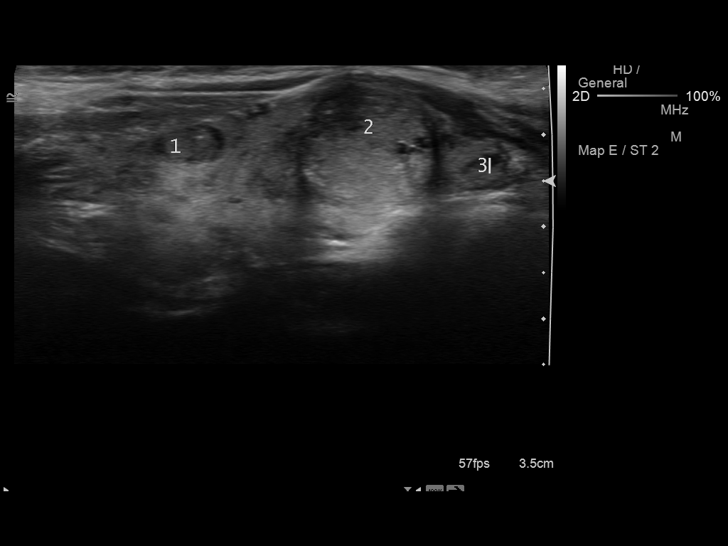
[im 57/63]
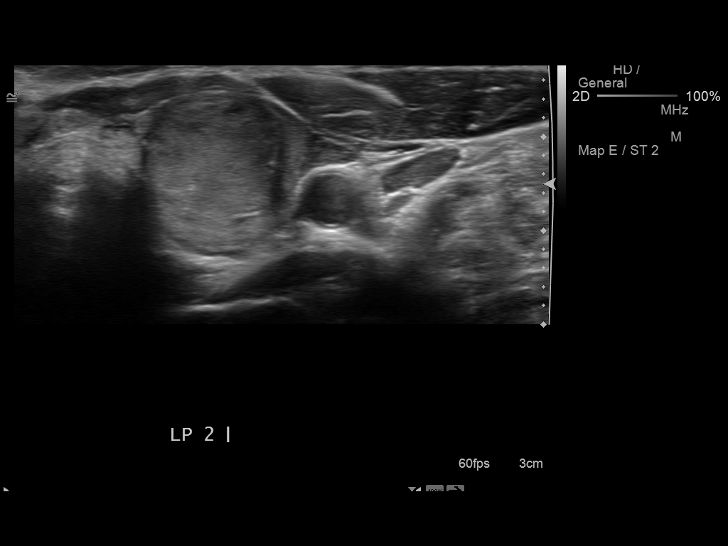
[im 63/63]
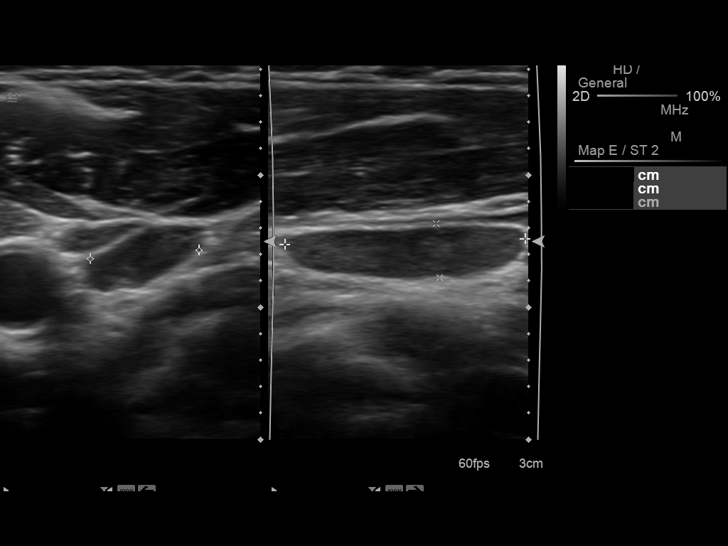

[13 of 25 positions shown; findings below may reference images not displayed]

FINDINGS: Right thyroid lobe

Measurements: 5.3 x 1.2 x 1.0 cm. Multiple small hypoechoic nodules,
most of which are less than a cm in size. There is an isoechoic
right lower pole nodule which measures 11 x 8 x 5 mm.

Left thyroid lobe

Measurements: 5.8 x 1.8 x 2.1 cm. Isoechoic solid nodule in the
midpole measures 2.0 x 1.8 x 1.6 cm. Focus of central
microcalcification. Adjacent 9 x 7 x 5 mm upper pole isoechoic
nodule.

Isthmus

Thickness: 5 mm.  No nodules visualized.

Lymphadenopathy

Elongated lymph nodes in the left neck which are not pathologically
enlarged based on size. Short axis diameter is approximately 4 mm.
IMPRESSION: Multiple bilateral thyroid nodules. The largest is in the left
midpole measuring up to 2 cm containing a focus of internal
microcalcification. Findings meet consensus criteria for biopsy.
Ultrasound-guided fine needle aspiration should be considered, as
per the consensus statement: Management of Thyroid Nodules Detected
at US: Society of Radiologists in Ultrasound Consensus Conference

## 2016-01-18 IMAGING — US ULTRASOUND CORE BIOPSY
1 series · 11 of 11 positions shown · non-contrast
Comparison: Thyroid Ultrasound - 07/17/2013

MEDICATIONS:
None

COMPLICATIONS:
None immediate

INDICATION: Indeterminate thyroid nodule

EXAM:
ULTRASOUND GUIDED FINE NEEDLE ASPIRATION OF INDETERMINATE THYROID
NODULE
TECHNIQUE: Informed written consent was obtained from the patient after a
discussion of the risks, benefits and alternatives to treatment.
Questions regarding the procedure were encouraged and answered. A
timeout was performed prior to the initiation of the procedure.

[Series 1: ultrasound core biopsy · 0.07mm/px · 11 acquisitions, 11 frames shown]
[im 1/11]
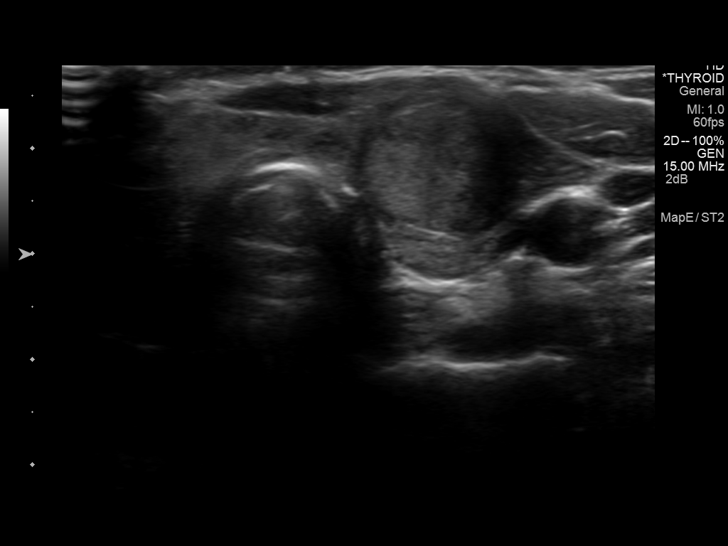
[im 2/11]
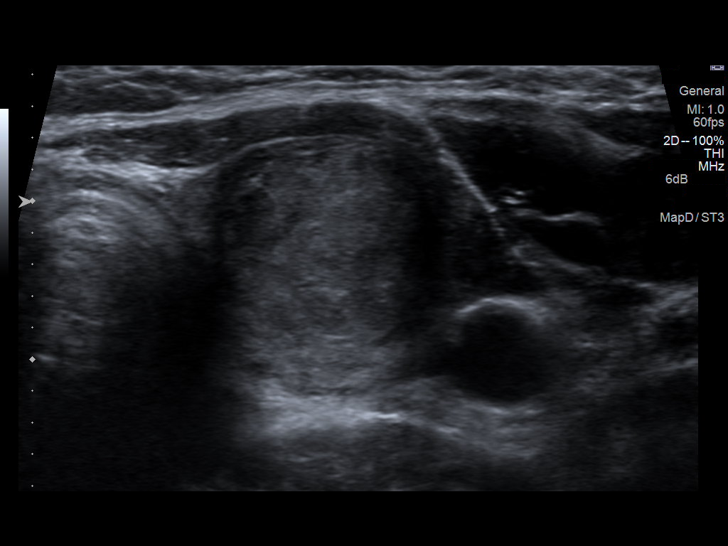
[im 3/11]
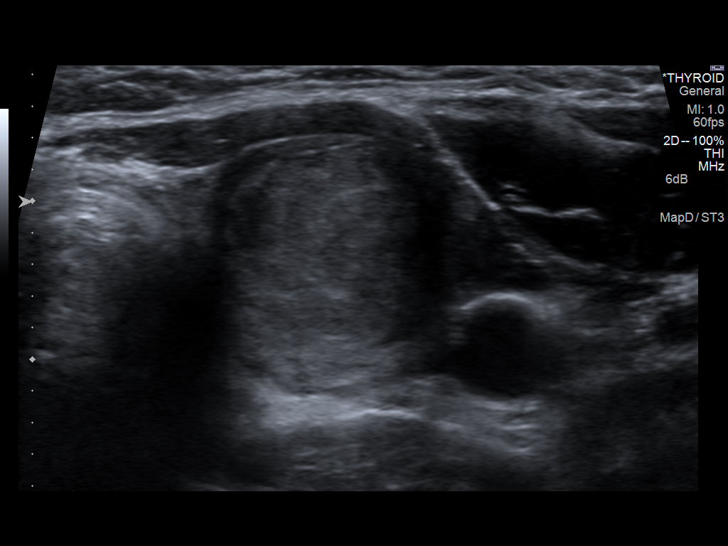
[im 4/11]
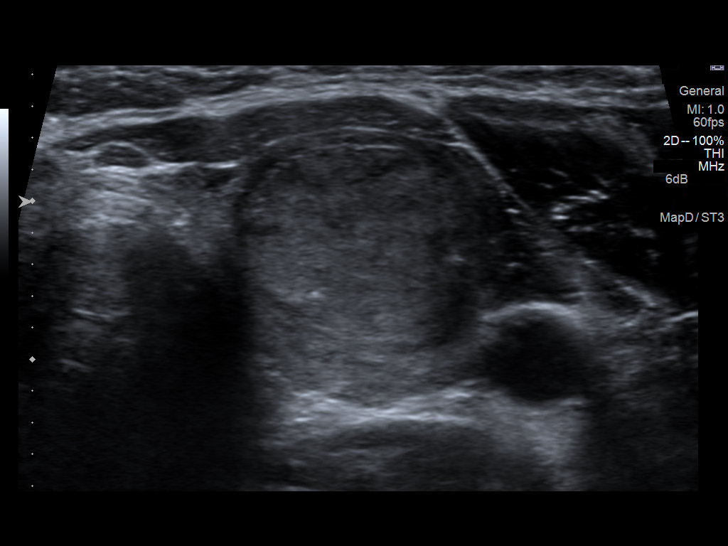
[im 5/11]
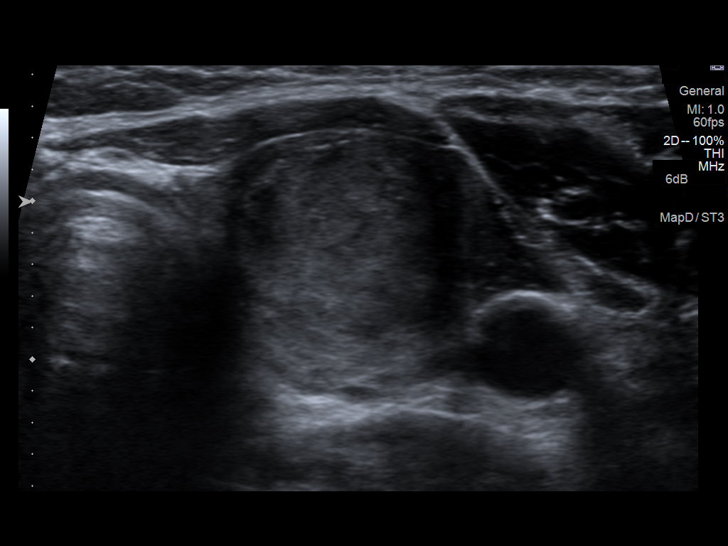
[im 6/11]
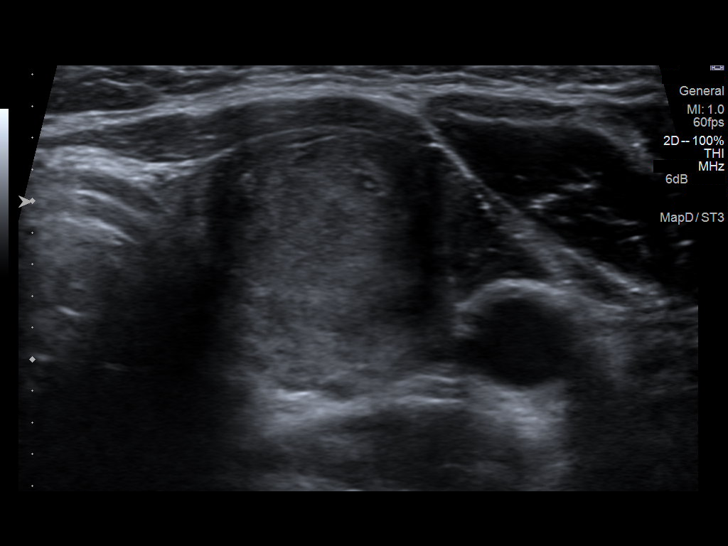
[im 7/11]
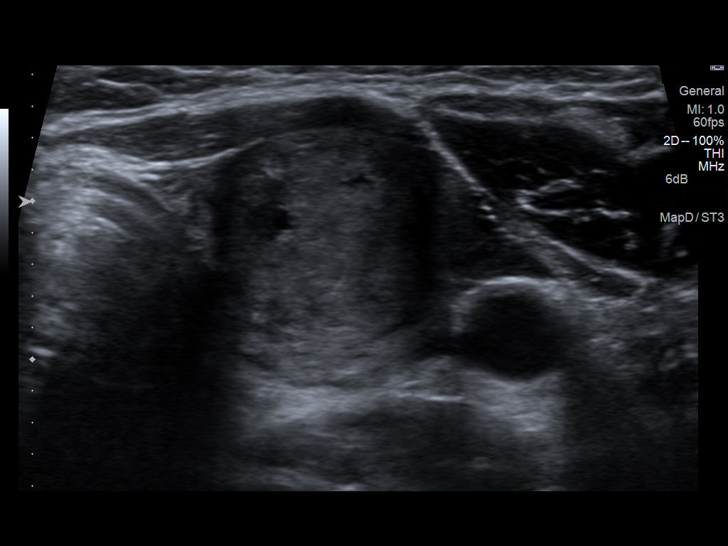
[im 8/11]
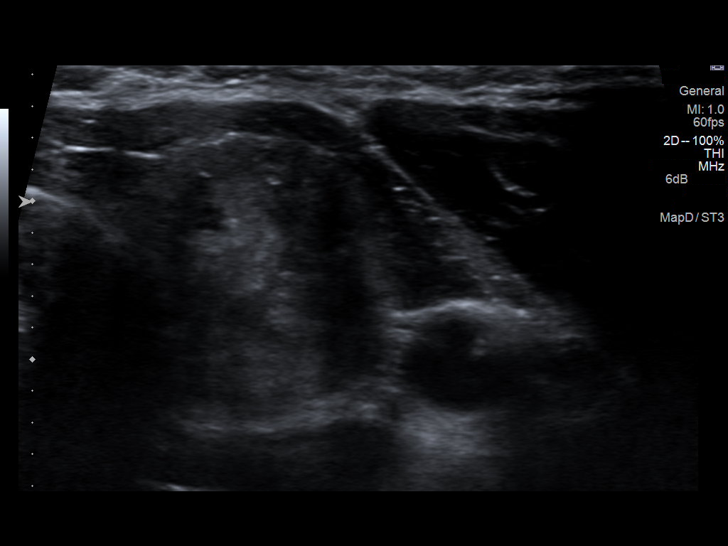
[im 9/11]
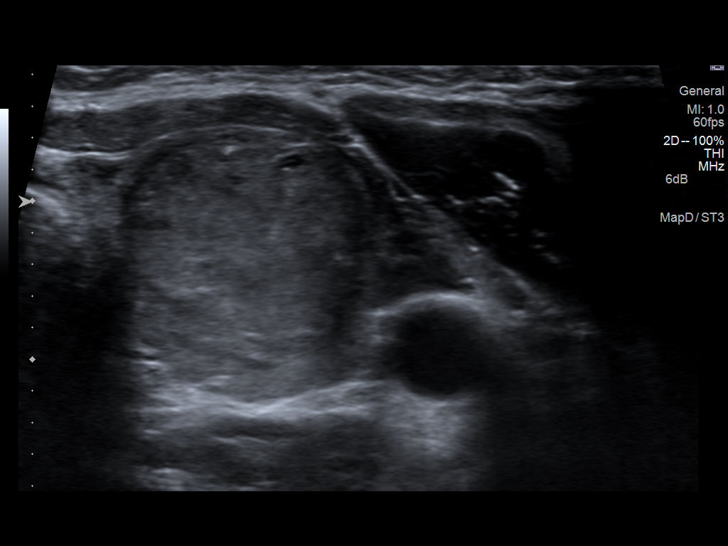
[im 10/11]
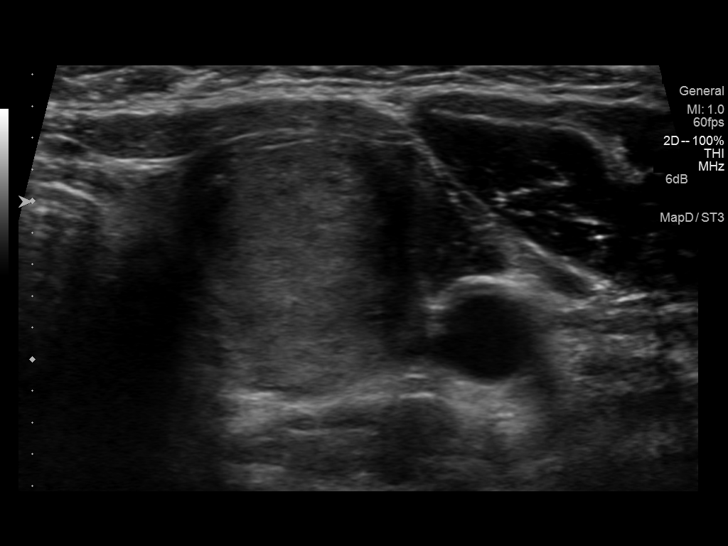
[im 11/11]
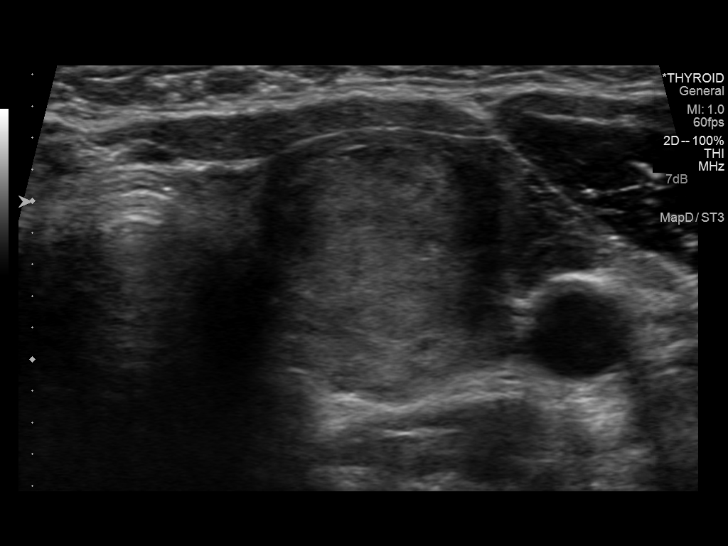

[11 of 11 positions shown; findings below may reference images not displayed]

Pre-procedural ultrasound scanning demonstrated grossly unchanged
appearance of dominant mixed echogenic solid nodule within the mid
aspect of the left lobe of the thyroid.

The procedure was planned. The neck was prepped in the usual sterile
fashion, and a sterile drape was applied covering the operative
field. A timeout was performed prior to the initiation of the
procedure. Local anesthesia was provided with 1% lidocaine.

Under direct ultrasound guidance, 4 FNA biopsies were performed of
the dominant nodule in the mid aspect of the left lobe of the
thyroid with a 25 gauge needle. The samples were prepared and
submitted to pathology.

Limited post procedural scanning was negative for hematoma or
additional complication. Dressings were placed. The patient
tolerated the above procedures procedure well without immediate
postprocedural complication.
IMPRESSION: Technically successful ultrasound guided fine needle aspiration of
dominant nodule within the mid aspect of the left lobe of the
thyroid.

## 2016-06-08 DIAGNOSIS — Z85850 Personal history of malignant neoplasm of thyroid: Secondary | ICD-10-CM | POA: Diagnosis not present

## 2016-06-08 DIAGNOSIS — E890 Postprocedural hypothyroidism: Secondary | ICD-10-CM | POA: Diagnosis not present

## 2016-06-15 DIAGNOSIS — E890 Postprocedural hypothyroidism: Secondary | ICD-10-CM | POA: Diagnosis not present

## 2016-06-15 DIAGNOSIS — Z85850 Personal history of malignant neoplasm of thyroid: Secondary | ICD-10-CM | POA: Diagnosis not present

## 2016-08-02 DIAGNOSIS — E890 Postprocedural hypothyroidism: Secondary | ICD-10-CM | POA: Diagnosis not present

## 2016-09-12 DIAGNOSIS — E890 Postprocedural hypothyroidism: Secondary | ICD-10-CM | POA: Diagnosis not present

## 2016-09-19 DIAGNOSIS — E890 Postprocedural hypothyroidism: Secondary | ICD-10-CM | POA: Diagnosis not present

## 2016-09-19 DIAGNOSIS — Z85850 Personal history of malignant neoplasm of thyroid: Secondary | ICD-10-CM | POA: Diagnosis not present

## 2016-09-19 DIAGNOSIS — M791 Myalgia: Secondary | ICD-10-CM | POA: Diagnosis not present

## 2016-10-10 DIAGNOSIS — R51 Headache: Secondary | ICD-10-CM | POA: Diagnosis not present

## 2016-10-10 DIAGNOSIS — R200 Anesthesia of skin: Secondary | ICD-10-CM | POA: Diagnosis not present

## 2016-10-10 DIAGNOSIS — R202 Paresthesia of skin: Secondary | ICD-10-CM | POA: Diagnosis not present

## 2016-10-26 DIAGNOSIS — E890 Postprocedural hypothyroidism: Secondary | ICD-10-CM | POA: Diagnosis not present

## 2016-11-08 DIAGNOSIS — R200 Anesthesia of skin: Secondary | ICD-10-CM | POA: Diagnosis not present

## 2016-11-08 DIAGNOSIS — R51 Headache: Secondary | ICD-10-CM | POA: Diagnosis not present

## 2016-11-08 DIAGNOSIS — R002 Palpitations: Secondary | ICD-10-CM | POA: Diagnosis not present

## 2016-11-16 DIAGNOSIS — R51 Headache: Secondary | ICD-10-CM | POA: Diagnosis not present

## 2016-11-16 DIAGNOSIS — R002 Palpitations: Secondary | ICD-10-CM | POA: Diagnosis not present

## 2016-11-18 DIAGNOSIS — R002 Palpitations: Secondary | ICD-10-CM | POA: Diagnosis not present

## 2016-11-18 DIAGNOSIS — G44201 Tension-type headache, unspecified, intractable: Secondary | ICD-10-CM | POA: Diagnosis not present

## 2016-12-27 DIAGNOSIS — R002 Palpitations: Secondary | ICD-10-CM | POA: Diagnosis not present

## 2016-12-27 DIAGNOSIS — G44219 Episodic tension-type headache, not intractable: Secondary | ICD-10-CM | POA: Diagnosis not present

## 2017-01-20 DIAGNOSIS — E890 Postprocedural hypothyroidism: Secondary | ICD-10-CM | POA: Diagnosis not present

## 2017-01-30 DIAGNOSIS — E890 Postprocedural hypothyroidism: Secondary | ICD-10-CM | POA: Diagnosis not present

## 2017-01-30 DIAGNOSIS — Z85850 Personal history of malignant neoplasm of thyroid: Secondary | ICD-10-CM | POA: Diagnosis not present

## 2017-05-12 DIAGNOSIS — Z01419 Encounter for gynecological examination (general) (routine) without abnormal findings: Secondary | ICD-10-CM | POA: Diagnosis not present

## 2017-05-12 DIAGNOSIS — Z8042 Family history of malignant neoplasm of prostate: Secondary | ICD-10-CM | POA: Diagnosis not present

## 2017-05-12 DIAGNOSIS — Z85850 Personal history of malignant neoplasm of thyroid: Secondary | ICD-10-CM | POA: Diagnosis not present

## 2017-05-12 DIAGNOSIS — Z803 Family history of malignant neoplasm of breast: Secondary | ICD-10-CM | POA: Diagnosis not present

## 2017-06-16 DIAGNOSIS — Z0000 Encounter for general adult medical examination without abnormal findings: Secondary | ICD-10-CM | POA: Diagnosis not present

## 2017-06-16 DIAGNOSIS — E890 Postprocedural hypothyroidism: Secondary | ICD-10-CM | POA: Diagnosis not present

## 2017-06-16 DIAGNOSIS — Z13220 Encounter for screening for lipoid disorders: Secondary | ICD-10-CM | POA: Diagnosis not present

## 2017-09-11 DIAGNOSIS — R002 Palpitations: Secondary | ICD-10-CM | POA: Diagnosis not present

## 2017-09-11 DIAGNOSIS — Z13220 Encounter for screening for lipoid disorders: Secondary | ICD-10-CM | POA: Diagnosis not present

## 2017-09-11 DIAGNOSIS — Z85850 Personal history of malignant neoplasm of thyroid: Secondary | ICD-10-CM | POA: Diagnosis not present

## 2017-09-18 DIAGNOSIS — E890 Postprocedural hypothyroidism: Secondary | ICD-10-CM | POA: Diagnosis not present

## 2017-09-18 DIAGNOSIS — Z85850 Personal history of malignant neoplasm of thyroid: Secondary | ICD-10-CM | POA: Diagnosis not present

## 2018-03-14 DIAGNOSIS — Z85850 Personal history of malignant neoplasm of thyroid: Secondary | ICD-10-CM | POA: Diagnosis not present

## 2018-03-14 DIAGNOSIS — E890 Postprocedural hypothyroidism: Secondary | ICD-10-CM | POA: Diagnosis not present

## 2018-03-21 DIAGNOSIS — E890 Postprocedural hypothyroidism: Secondary | ICD-10-CM | POA: Diagnosis not present

## 2018-03-21 DIAGNOSIS — Z85850 Personal history of malignant neoplasm of thyroid: Secondary | ICD-10-CM | POA: Diagnosis not present

## 2018-05-15 DIAGNOSIS — Z124 Encounter for screening for malignant neoplasm of cervix: Secondary | ICD-10-CM | POA: Diagnosis not present

## 2018-05-15 DIAGNOSIS — Z01419 Encounter for gynecological examination (general) (routine) without abnormal findings: Secondary | ICD-10-CM | POA: Diagnosis not present

## 2018-12-25 ENCOUNTER — Observation Stay (HOSPITAL_COMMUNITY)
Admission: EM | Admit: 2018-12-25 | Discharge: 2018-12-27 | Disposition: A | Discharge status: Home / Self Care | Payer: 59 | Attending: Obstetrics | Admitting: Obstetrics

## 2018-12-25 DIAGNOSIS — Z85850 Personal history of malignant neoplasm of thyroid: Secondary | ICD-10-CM | POA: Insufficient documentation

## 2018-12-25 DIAGNOSIS — D649 Anemia, unspecified: Secondary | ICD-10-CM

## 2018-12-25 DIAGNOSIS — Z881 Allergy status to other antibiotic agents status: Secondary | ICD-10-CM | POA: Diagnosis not present

## 2018-12-25 DIAGNOSIS — D62 Acute posthemorrhagic anemia: Principal | ICD-10-CM | POA: Insufficient documentation

## 2018-12-25 DIAGNOSIS — Z880 Allergy status to penicillin: Secondary | ICD-10-CM | POA: Insufficient documentation

## 2018-12-25 DIAGNOSIS — E039 Hypothyroidism, unspecified: Secondary | ICD-10-CM | POA: Insufficient documentation

## 2018-12-25 DIAGNOSIS — R42 Dizziness and giddiness: Secondary | ICD-10-CM | POA: Insufficient documentation

## 2018-12-25 DIAGNOSIS — Z882 Allergy status to sulfonamides status: Secondary | ICD-10-CM | POA: Diagnosis not present

## 2018-12-25 DIAGNOSIS — R0602 Shortness of breath: Secondary | ICD-10-CM | POA: Diagnosis not present

## 2018-12-25 DIAGNOSIS — Z20828 Contact with and (suspected) exposure to other viral communicable diseases: Secondary | ICD-10-CM | POA: Diagnosis not present

## 2018-12-25 DIAGNOSIS — Z0184 Encounter for antibody response examination: Secondary | ICD-10-CM | POA: Insufficient documentation

## 2018-12-25 DIAGNOSIS — R000 Tachycardia, unspecified: Secondary | ICD-10-CM | POA: Diagnosis not present

## 2018-12-25 DIAGNOSIS — N920 Excessive and frequent menstruation with regular cycle: Secondary | ICD-10-CM | POA: Diagnosis not present

## 2018-12-25 DIAGNOSIS — Z79890 Hormone replacement therapy (postmenopausal): Secondary | ICD-10-CM | POA: Insufficient documentation

## 2018-12-25 DIAGNOSIS — Z79899 Other long term (current) drug therapy: Secondary | ICD-10-CM | POA: Insufficient documentation

## 2018-12-25 HISTORY — DX: Malignant (primary) neoplasm, unspecified: C80.1

## 2018-12-25 HISTORY — DX: Gastro-esophageal reflux disease without esophagitis: K21.9

## 2018-12-25 HISTORY — DX: Headache, unspecified: R51.9

## 2018-12-25 LAB — COMPREHENSIVE METABOLIC PANEL
ALT: 12 U/L (ref 0–44)
AST: 15 U/L (ref 15–41)
Albumin: 3 g/dL — ABNORMAL LOW (ref 3.5–5.0)
Alkaline Phosphatase: 43 U/L (ref 38–126)
Anion gap: 10 (ref 5–15)
BUN: 9 mg/dL (ref 6–20)
CO2: 20 mmol/L — ABNORMAL LOW (ref 22–32)
Calcium: 8.4 mg/dL — ABNORMAL LOW (ref 8.9–10.3)
Chloride: 106 mmol/L (ref 98–111)
Creatinine, Ser: 0.71 mg/dL (ref 0.44–1.00)
GFR calc Af Amer: >60 mL/min (ref >60)
GFR calc non Af Amer: >60 mL/min (ref >60)
Glucose, Bld: 123 mg/dL — ABNORMAL HIGH (ref 70–99)
Potassium: 3.6 mmol/L (ref 3.5–5.1)
Sodium: 136 mmol/L (ref 135–145)
Total Bilirubin: 0.5 mg/dL (ref 0.3–1.2)
Total Protein: 5.6 g/dL — ABNORMAL LOW (ref 6.5–8.1)

## 2018-12-25 LAB — CBC
HCT: 23.1 % — ABNORMAL LOW (ref 36.0–46.0)
HCT: 21.7 % — ABNORMAL LOW (ref 36.0–46.0)
Hemoglobin: 7.6 g/dL — ABNORMAL LOW (ref 12.0–15.0)
Hemoglobin: 7.3 g/dL — ABNORMAL LOW (ref 12.0–15.0)
MCH: 32.8 pg (ref 26.0–34.0)
MCH: 31.7 pg (ref 26.0–34.0)
MCHC: 32.9 g/dL (ref 30.0–36.0)
MCHC: 33.6 g/dL (ref 30.0–36.0)
MCV: 99.6 fL (ref 80.0–100.0)
MCV: 94.3 fL (ref 80.0–100.0)
Platelets: 315 10*3/uL (ref 150–400)
Platelets: 235 10*3/uL (ref 150–400)
RBC: 2.32 MIL/uL — ABNORMAL LOW (ref 3.87–5.11)
RBC: 2.3 MIL/uL — ABNORMAL LOW (ref 3.87–5.11)
RDW: 14.7 % (ref 11.5–15.5)
RDW: 15.3 % (ref 11.5–15.5)
WBC: 8.1 10*3/uL (ref 4.0–10.5)
WBC: 10.5 10*3/uL (ref 4.0–10.5)
nRBC: 0 % (ref 0.0–0.2)
nRBC: 0 % (ref 0.0–0.2)

## 2018-12-25 LAB — PREPARE RBC (CROSSMATCH)

## 2018-12-25 LAB — I-STAT BETA HCG BLOOD, ED (MC, WL, AP ONLY): I-stat hCG, quantitative: <5 m[IU]/mL (ref <5)

## 2018-12-25 LAB — ABO/RH: ABO/RH(D): O POS

## 2018-12-25 LAB — SARS CORONAVIRUS 2 (TAT 6-24 HRS): SARS Coronavirus 2: NEGATIVE

## 2018-12-25 MED ORDER — ESTROGENS CONJUGATED 25 MG IJ SOLR
25.0000 mg | Freq: Once | INTRAMUSCULAR | Status: AC
  Administered 2018-12-25: 25 mg via INTRAVENOUS
  Filled 2018-12-25: qty 25

## 2018-12-25 MED ORDER — SODIUM CHLORIDE 0.9 % IV BOLUS
1000.0000 mL | Freq: Once | INTRAVENOUS | Status: AC
  Administered 2018-12-25: 12:00:00 1000 mL via INTRAVENOUS

## 2018-12-25 MED ORDER — TRANEXAMIC ACID-NACL 1000-0.7 MG/100ML-% IV SOLN
1000.0000 mg | Freq: Once | INTRAVENOUS | Status: AC
  Administered 2018-12-25: 1000 mg via INTRAVENOUS
  Filled 2018-12-25 (×2): qty 100

## 2018-12-25 MED ORDER — SODIUM CHLORIDE 0.9 % IV SOLN
10.0000 mL/h | Freq: Once | INTRAVENOUS | Status: AC
  Administered 2018-12-25: 16:00:00 10 mL/h via INTRAVENOUS

## 2018-12-25 MED ORDER — ONDANSETRON HCL 4 MG/2ML IJ SOLN
4.0000 mg | Freq: Once | INTRAMUSCULAR | Status: AC
  Administered 2018-12-25: 4 mg via INTRAVENOUS
  Filled 2018-12-25: qty 2

## 2018-12-25 MED ORDER — ALUM & MAG HYDROXIDE-SIMETH 200-200-20 MG/5ML PO SUSP: 30.0000 mL | ORAL | Status: DC | PRN

## 2018-12-25 MED ORDER — ONDANSETRON HCL 4 MG PO TABS: 4.0000 mg | ORAL_TABLET | Freq: Four times a day (QID) | ORAL | Status: DC | PRN

## 2018-12-25 MED ORDER — LACTATED RINGERS IV SOLN
INTRAVENOUS | Status: DC
  Administered 2018-12-25 – 2018-12-27 (×5): via INTRAVENOUS

## 2018-12-25 MED ORDER — PRENATAL MULTIVITAMIN CH
1.0000 | ORAL_TABLET | Freq: Every day | ORAL | Status: DC
  Administered 2018-12-26: 11:00:00 1 via ORAL
  Filled 2018-12-25 (×2): qty 1

## 2018-12-25 MED ORDER — SODIUM CHLORIDE 0.9 % IV BOLUS
2000.0000 mL | Freq: Once | INTRAVENOUS | Status: AC
  Administered 2018-12-25: 13:00:00 2000 mL via INTRAVENOUS

## 2018-12-25 MED ORDER — IBUPROFEN 600 MG PO TABS
600.0000 mg | ORAL_TABLET | Freq: Four times a day (QID) | ORAL | Status: DC | PRN
  Filled 2018-12-25: qty 1

## 2018-12-25 MED ORDER — ONDANSETRON HCL 4 MG/2ML IJ SOLN: 4.0000 mg | Freq: Four times a day (QID) | INTRAMUSCULAR | Status: DC | PRN

## 2018-12-25 NOTE — ED Notes (Signed)
ED TO INPATIENT HANDOFF REPORT  ED Nurse Name and Phone #: [phone]  S Name/Age/Gender Melanie [name] 38 y.o. female Room/Bed: 030C/030C  Code Status   Code Status: Full Code  Home/SNF/Other Home Patient oriented to: self, place, time and situation Is this baseline? Yes   Triage Complete: Triage complete  Chief Complaint bleeding  Triage Note Pt reports heavy vaginal bleeding that started on Friday and has progressively got worse to the point of changing heavy pad every 1-2 hour.   P:y has spoken to [facility] doctor and was instructed to take 2 BC pills and that did slow down the bleeding yesterday for about 5 hours. This morning pt felt faint appears pale and still having heavy bleeding.    Allergies Allergies  Allergen Reactions  . Keflex [Cephalexin] Hives  . Penicillins Rash  . Sulfa Antibiotics Rash    Level of Care/Admitting Diagnosis ED Disposition    ED Disposition Condition [facility] Area: [place] [100100]  Level of Care: Med-Surg [16]  Covid Evaluation: Asymptomatic Screening Protocol (No Symptoms)  Diagnosis: Menorrhagia [id number]  Admitting Physician: Jerelyn Charles [id number]  Attending Physician: Jerelyn Charles [phone]  PT Class (Do Not Modify): Observation [104]  PT Acc Code (Do Not Modify): Observation [10022]       B Medical/Surgery History No past medical history on file.    A IV Location/Drains/Wounds Patient Lines/Drains/Airways Status   Active Line/Drains/Airways    Name:   Placement date:   Placement time:   Site:   Days:   Peripheral IV 12/25/18 Right Antecubital   12/25/18    1218    Antecubital   less than 1   Peripheral IV 12/25/18 Left Antecubital   12/25/18    1314    Antecubital   less than 1          Intake/Output Last 24 hours  Intake/Output Summary (Last 24 hours) at 12/25/2018 1757 Last data filed at 12/25/2018 1624 Gross per 24 hour  Intake 2343.75 ml  Output -  Net 2343.75 ml     Labs/Imaging Results for orders placed or performed during the hospital encounter of 12/25/18 (from the past 48 hour(s))  Type and screen [place]     Status: None (Preliminary result)   Collection Time: 12/25/18 11:36 AM  Result Value Ref Range   ABO/RH(D) O POS    Antibody Screen NEG    Sample Expiration 12/28/2018,2359    Unit Number [id number]    Blood Component Type RED CELLS,LR    Unit division 00    Status of Unit ISSUED    Transfusion Status OK TO TRANSFUSE    Crossmatch Result      Compatible Performed at [facility], [address], [place], [place] [address]    Unit Number [id number]    Blood Component Type RBC LR PHER1    Unit division 00    Status of Unit ISSUED    Transfusion Status OK TO TRANSFUSE    Crossmatch Result Compatible   Prepare RBC     Status: None   Collection Time: 12/25/18 11:36 AM  Result Value Ref Range   Order Confirmation      ORDER PROCESSED BY BLOOD BANK Performed at [facility], [place] [address] [place], [place] [address]   ABO/Rh     Status: None   Collection Time: 12/25/18 11:36 AM  Result Value Ref Range   ABO/RH(D)  O POS Performed at [facility], [place] [address], [place], [place] [address]   Comprehensive metabolic panel     Status: Abnormal   Collection Time: 12/25/18 11:40 AM  Result Value Ref Range   Sodium 136 135 - 145 mmol/L   Potassium 3.6 3.5 - 5.1 mmol/L   Chloride 106 98 - 111 mmol/L   CO2 20 (L) 22 - 32 mmol/L   Glucose, Bld 123 (H) 70 - 99 mg/dL   BUN 9 6 - 20 mg/dL   Creatinine, Ser 0.71 0.44 - 1.00 mg/dL   Calcium 8.4 (L) 8.9 - 10.3 mg/dL   Total Protein 5.6 (L) 6.5 - 8.1 g/dL   Albumin 3.0 (L) 3.5 - 5.0 g/dL   AST 15 15 - 41 U/L   ALT 12 0 - 44 U/L   Alkaline Phosphatase 43 38 - 126 U/L   Total Bilirubin 0.5 0.3 - 1.2 mg/dL   GFR calc non Af Amer >60 >60 mL/min   GFR calc Af Amer >60 >60 mL/min   Anion gap 10 5 - 15    Comment: Performed at [place] [address], [place], [place] [address]  CBC     Status: Abnormal   Collection Time: 12/25/18 11:40 AM  Result Value Ref Range   WBC 8.1 4.0 - 10.5 K/uL   RBC 2.32 (L) 3.87 - 5.11 MIL/uL   Hemoglobin 7.6 (L) 12.0 - 15.0 g/dL   HCT 23.1 (L) 36.0 - 46.0 %   MCV 99.6 80.0 - 100.0 fL   MCH 32.8 26.0 - 34.0 pg   MCHC 32.9 30.0 - 36.0 g/dL   RDW 14.7 11.5 - 15.5 %   Platelets 315 150 - 400 K/uL   nRBC 0.0 0.0 - 0.2 %    Comment: Performed at [facility], [place] [address], [place], [place] [address]  I-Stat beta hCG blood, ED     Status: None   Collection Time: 12/25/18 12:23 PM  Result Value Ref Range   I-stat hCG, quantitative <5.0 <5 mIU/mL   Comment 3            Comment:   GEST. AGE      CONC.  (mIU/mL)   <=1 WEEK        5 - 50     2 WEEKS       50 - 500     3 WEEKS       100 - 10,000     4 WEEKS     1,000 - 30,000        FEMALE AND NON-PREGNANT FEMALE:     LESS THAN 5 mIU/mL    No results found.  Pending Labs Unresulted Labs (From admission, onward)    Start     Ordered   12/26/18 0000  CBC  Tomorrow morning,   R    Comments: Please draw 4 hours post-transfusion    12/25/18 1701   12/25/18 1225  SARS CORONAVIRUS 2 (TAT 6-12 HRS) Nasal Swab Aptima Multi Swab  (Asymptomatic/Tier 2 Patients Labs)  Once,   STAT    Question Answer Comment  Is this test for diagnosis or screening Screening   Symptomatic for COVID-19 as defined by CDC No   Hospitalized for COVID-19 No   Admitted to ICU for COVID-19 No   Previously tested for COVID-19 No   Resident in a congregate (group) care setting No   Employed in healthcare setting No  Pregnant Unknown      12/25/18 1224          Vitals/Pain Today's Vitals   12/25/18 1634 12/25/18 1644 12/25/18 1700 12/25/18 1730  BP:  (!) 140/98 128/83 104/67  Pulse:  (!) 154 (!) 121 (!) 114  Resp:  16 (!) 21 17  Temp:  99.2 F (37.3 C)    TempSrc:  Oral    SpO2:  99% 98% 98%  PainSc: 0-No pain       Isolation  Precautions No active isolations  Medications Medications  prenatal multivitamin tablet 1 tablet (has no administration in time range)  lactated ringers infusion ( Intravenous New Bag/Given 12/25/18 1722)  ondansetron (ZOFRAN) tablet 4 mg (has no administration in time range)    Or  ondansetron (ZOFRAN) injection 4 mg (has no administration in time range)  alum & mag hydroxide-simeth (MAALOX/MYLANTA) 200-200-20 MG/5ML suspension 30 mL (has no administration in time range)  ibuprofen (ADVIL) tablet 600 mg (has no administration in time range)  sodium chloride 0.9 % bolus 1,000 mL (0 mLs Intravenous Stopped 12/25/18 1307)  sodium chloride 0.9 % bolus 2,000 mL (0 mLs Intravenous Stopped 12/25/18 1517)  0.9 %  sodium chloride infusion (0 mL/hr Intravenous Stopped 12/25/18 1624)  tranexamic acid (CYKLOKAPRON) IVPB 1,000 mg (0 mg Intravenous Stopped 12/25/18 1420)  conjugated estrogens (PREMARIN) injection 25 mg (25 mg Intravenous Given 12/25/18 1312)  ondansetron (ZOFRAN) injection 4 mg (4 mg Intravenous Given 12/25/18 1311)    Mobility walks Moderate fall risk   Focused Assessments GU   R Recommendations: See Admitting Provider Note  Report given to:   Additional Notes:

## 2018-12-25 NOTE — ED Triage Notes (Signed)
Pt reports heavy vaginal bleeding that started on Friday and has progressively got worse to the point of changing heavy pad every 1-2 hour.   P:y has spoken to [facility] doctor and was instructed to take 2 BC pills and that did slow down the bleeding yesterday for about 5 hours. This morning pt felt faint appears pale and still having heavy bleeding.

## 2018-12-25 NOTE — Progress Notes (Signed)
Attempted to call attending to inquire about patient's diet as she is showing as NPO but is hungry and does not know why she would not be able to eat. Per assistant answering the phone, Dr. Carlis Abbott is in the middle of a procedure and would like for [place] to call back in about an hour. This RN expressed that the cafeteria closes at [place] asked that the doctor be called back in 30 minutes. Will endorse to nightshift RN.

## 2018-12-25 NOTE — ED Provider Notes (Addendum)
[facility] EMERGENCY DEPARTMENT Provider Note   CSN: [id number] Arrival date & time: 12/25/18  1125     History   Chief Complaint Chief Complaint  Patient presents with  . Vaginal Bleeding    HPI Melanie [name] is a 38 y.o. female past medical history significant for thyroid cancer s/p thyroidectomy on levothyroxine presents to emergency department today with chief complaint of vaginal bleeding.  This is been going on x5 days.  Patient states she called her gynecologist Dr. Rogue Bussing with [facility] and was instructed to take to birth control pills for 3 days to help slow down bleeding.  Patient states despite doing this bleeding has not improved.  She is reporting passing large clots.  She is having to change her pad every 1-2 hours.  Patient states this morning when she stood up she felt lightheaded as if she was going to pass out.  She did not.  She does also report associated shortness of breath.  She denies fever, chills, chest pain, abdominal pain, nausea, vomiting. She denies history of fibroids.  Of note she states she has been told she is borderline anemic in the past however was never started on any iron supplements.  Her PCP has been closely following. History provided by patient with additional history obtained from chart review.      No past medical history on file.  There are no active problems to display for this patient.   OB History   No obstetric history on file.      Home Medications    Prior to Admission medications   Medication Sig Start Date End Date Taking? Authorizing Provider  cetirizine (ZYRTEC) 10 MG tablet Take 10 mg by mouth daily.   Yes [provider]  citalopram (CELEXA) 20 MG tablet Take 20 mg by mouth daily. 12/05/18  Yes [provider]  fluticasone (FLONASE) 50 MCG/ACT nasal spray Place 1 spray into both nostrils daily.   Yes [provider]  levothyroxine (SYNTHROID) 100 MCG tablet Take  100-200 mcg by mouth daily before breakfast. 100 mcg every day except on Sunday patient takes 200 mcg   Yes [provider]  Multiple Vitamins-Minerals (MULTIVITAMIN WITH MINERALS) tablet Take 1 tablet by mouth daily.   Yes [provider]  norethindrone-ethinyl estradiol (JUNEL 1/20) 1-20 MG-MCG tablet Take 1 tablet by mouth daily. 02/19/16  Yes [provider]    Family History No family history on file.  Social History Social History   Tobacco Use  . Smoking status: Not on file  Substance Use Topics  . Alcohol use: Not on file  . Drug use: Not on file     Allergies   Keflex [cephalexin], Penicillins, and Sulfa antibiotics   Review of Systems Review of Systems  Constitutional: Negative for chills and fever.  HENT: Negative for congestion, ear discharge, ear pain, sinus pressure, sinus pain and sore throat.   Eyes: Negative for pain and redness.  Respiratory: Negative for cough and shortness of breath.   Cardiovascular: Negative for chest pain.  Gastrointestinal: Negative for abdominal pain, constipation, diarrhea, nausea and vomiting.  Genitourinary: Positive for vaginal bleeding. Negative for dysuria, hematuria, pelvic pain and vaginal pain.  Musculoskeletal: Negative for back pain and neck pain.  Skin: Negative for wound.  Neurological: Positive for dizziness and weakness. Negative for numbness and headaches.     Physical Exam Updated Vital Signs BP (!) 139/91   Pulse (!) 143   Temp 99.2 F (  37.3 C) (Oral)   Resp 18   SpO2 100%   Physical Exam Vitals signs and nursing note reviewed.  Constitutional:      Appearance: She is not ill-appearing.     Comments: Patient is pale including conjunctiva, gums, and lips.   HENT:     Head: Normocephalic and atraumatic.     Right Ear: Tympanic membrane and external ear normal.     Left Ear: Tympanic membrane and external ear normal.     Nose: Nose normal.     Mouth/Throat:     Mouth: Mucous  membranes are dry.     Pharynx: Oropharynx is clear.  Eyes:     General: No scleral icterus.       Right eye: No discharge.        Left eye: No discharge.     Extraocular Movements: Extraocular movements intact.     Conjunctiva/sclera: Conjunctivae normal.     Pupils: Pupils are equal, round, and reactive to light.  Neck:     Musculoskeletal: Normal range of motion.     Vascular: No JVD.  Cardiovascular:     Rate and Rhythm: Regular rhythm. Tachycardia present.     Pulses: Normal pulses.          Radial pulses are 2+ on the right side and 2+ on the left side.     Heart sounds: Normal heart sounds.  Pulmonary:     Comments: Lungs clear to auscultation in all fields. Symmetric chest rise. No wheezing, rales, or rhonchi. Abdominal:     Comments: Abdomen is soft, non-distended, and non-tender in all quadrants. No rigidity, no guarding. No peritoneal signs.  Genitourinary:    Comments: Normal external genitalia. No pain with speculum insertion. Closed cervical os with normal appearance - no rash or lesions. Bleeding in vaginal vault without clots. On bimanual examination no adnexal tenderness or cervical motion tenderness. Chaperone Madison RN present during exam.  Musculoskeletal: Normal range of motion.     Right lower leg: No edema.     Left lower leg: No edema.  Skin:    General: Skin is warm and dry.     Capillary Refill: Capillary refill takes less than 2 seconds.  Neurological:     Mental Status: She is oriented to person, place, and time.     GCS: GCS eye subscore is 4. GCS verbal subscore is 5. GCS motor subscore is 6.     Comments: Fluent speech, no facial droop.  Psychiatric:        Behavior: Behavior normal.      ED Treatments / Results  Labs (all labs ordered are listed, but only abnormal results are displayed) Labs Reviewed  COMPREHENSIVE METABOLIC PANEL - Abnormal; Notable for the following components:      Result Value   CO2 20 (*)    Glucose, Bld 123 (*)     Calcium 8.4 (*)    Total Protein 5.6 (*)    Albumin 3.0 (*)    All other components within normal limits  CBC - Abnormal; Notable for the following components:   RBC 2.32 (*)    Hemoglobin 7.6 (*)    HCT 23.1 (*)    All other components within normal limits  SARS CORONAVIRUS 2 (TAT 6-12 HRS)  I-STAT BETA HCG BLOOD, ED ([facility], [facility], [facility] ONLY)  TYPE AND SCREEN  PREPARE RBC (CROSSMATCH)  ABO/RH    EKG EKG Interpretation  Date/Time:  Tuesday December 25 2018 12:00:38 EDT Ventricular Rate:  137 PR Interval:    QRS Duration: 73 QT Interval:  359 QTC Calculation: 542 R Axis:   73 Text Interpretation:  Sinus tachycardia Nonspecific repol abnormality, inferior leads Prolonged QT interval Confirmed by Virgel Manifold [phone]) on 12/25/2018 1:27:22 PM   Radiology No results found.  Procedures .Critical Care Performed by: Cherre Robins, PA-C Authorized by: Cherre Robins, PA-C   Critical care provider statement:    Critical care time (minutes):  43   Critical care time was exclusive of:  Separately billable procedures and treating other patients and teaching time   Critical care was time spent personally by me on the following activities:  Development of treatment plan with patient or surrogate, discussions with consultants, evaluation of patient's response to treatment, examination of patient, obtaining history from patient or surrogate, ordering and performing treatments and interventions, ordering and review of laboratory studies, re-evaluation of patient's condition and review of old charts   (including critical care time)  Medications Ordered in ED Medications  sodium chloride 0.9 % bolus 1,000 mL (0 mLs Intravenous Stopped 12/25/18 1307)  sodium chloride 0.9 % bolus 2,000 mL (0 mLs Intravenous Stopped 12/25/18 1517)  0.9 %  sodium chloride infusion (0 mL/hr Intravenous Stopped 12/25/18 1624)  tranexamic acid (CYKLOKAPRON) IVPB 1,000 mg (0 mg Intravenous Stopped 12/25/18 1420)   conjugated estrogens (PREMARIN) injection 25 mg (25 mg Intravenous Given 12/25/18 1312)  ondansetron (ZOFRAN) injection 4 mg (4 mg Intravenous Given 12/25/18 1311)     Initial Impression / Assessment and Plan / ED Course  I have reviewed the triage vital signs and the nursing notes.  Pertinent labs & imaging results that were available during my care of the patient were reviewed by me and considered in my medical decision making (see chart for details).  38 yo female presents with heavy vaginal bleeding x 5 days.  On arrival patient is tachycardic to 143, no hypotension. She is pale. Beta hCG is negative. IV premarin and cykloapron started. Hemoglobin is 7.6. Given her symptomatic anemia including resting tachycardia of 140 with continued bleeding will transfuse 2 units. Pelvic exam performed that shows blood in vaginal vault, no clots or wound to suggest other source.  Case discussed with [facility] gyn to admit. Dr. Carlis Abbott will be down to see the patient. This is a shared visit with ED attending Dr. Wilson Singer. He personally saw and evaluated the patient and agrees with plan.   This note was prepared using Dragon voice recognition software and may include unintentional dictation errors due to the inherent limitations of voice recognition software.   Final Clinical Impressions(s) / ED Diagnoses   Final diagnoses:  Symptomatic anemia    ED Discharge Orders    None       Cherre Robins, PA-C 12/25/18 1628    Flint Melter 12/25/18 2229    Virgel Manifold, MD 12/28/18 1014

## 2018-12-25 NOTE — H&P (Signed)
38 y.o.  G4P4 presents with complaints of heavy vaginal bleeding. She has a history of HMB managed with OCPs. Over the last 5 days, she had acute worsening of bleeding. She was instructed to double her ocps x 3 days.   This did not help.  Today, she is changing a pad q1 hour with large clots.  She feels dizzy, lightheaded with shortness of breath.  On presentation to the emergency room, she was noted to be tachycardic with a pulse in the 140s.  She received a dose of IV premarin followed by IV lysteda.  Her bleeding slowed some.  She is now receiving her second bag of packed red blood cells.  She also received 2L of IVF.   PMHx: thyroid cancer  PSHx:  Cesarean section with BTL and thyroidectomy for thyroid cancer   Social History   Socioeconomic History  . Marital status: Married    Spouse name: Not on file  . Number of children: Not on file  . Years of education: Not on file  . Highest education level: Not on file  Occupational History  . Not on file  Social Needs  . Financial resource strain: Not on file  . Food insecurity    Worry: Not on file    Inability: Not on file  . Transportation needs    Medical: Not on file    Non-medical: Not on file  Tobacco Use  . Smoking status: Not on file  Substance and Sexual Activity  . Alcohol use: Not on file  . Drug use: Not on file  . Sexual activity: Not on file  Lifestyle  . Physical activity    Days per week: Not on file    Minutes per session: Not on file  . Stress: Not on file  Relationships  . Social Herbalist on phone: Not on file    Gets together: Not on file    Attends religious service: Not on file    Active member of club or organization: Not on file    Attends meetings of clubs or organizations: Not on file    Relationship status: Not on file  . Intimate partner violence    Fear of current or ex partner: Not on file    Emotionally abused: Not on file    Physically abused: Not on file    Forced sexual  activity: Not on file  Other Topics Concern  . Not on file  Social History Narrative  . Not on file   Keflex [cephalexin], Penicillins, and Sulfa antibiotics      Vitals:   12/25/18 1811 12/25/18 2026  BP: 105/67 120/62  Pulse: (!) 114 (!) 123  Resp: 14 16  Temp: 99.2 F (37.3 C) 99.8 F (37.7 C)  SpO2: 98% 100%     General: Pale HEENT:  Pale conjunctiva Abdomen:  Soft, non-tender, non-distended, no masses Pelvic:  Large amount of clot in vagina.  Cervix is normal.  Clot clear and slow trickle of blood from cervix Bimanual--8-10 week sized enlarged, mobile uterus  A/P:  G4P4 with symptomatic menorrhagia to anemia.  Receiving 2 units of PRBCs.  Will monitor VS and check cbc 4 hours post-transfusion\ Monitor bleeding--consider repeating dose of IV premarin as needed Further work up, including ultrasound, as outpatient  [place]

## 2018-12-25 NOTE — ED Provider Notes (Signed)
Medical screening examination/treatment/procedure(s) were conducted as a shared visit with non-physician practitioner(s) and myself.  I personally evaluated the patient during the encounter.    37yF with ~5d history of heavy vaginal bleeding. Estimates needing new pad every 1-2 hours. Last night and this morning feeling lightheaded and sob, particularly with changes in position and activity. Feels like heart is pounding. Occasional abdominal/pelvic cramping. Called her GYN and advised to double up on OCP which seemed to temporarily slow bleeding yesterday. Says her periods have always been heavy but not to this degree. S/p tubal ligation. Not anticoagulated. Denies trauma.  No hx of fibroids or malignancy that she is aware of.   On exam she doesn't appear ill but is pale. Resting tachycardia in 130-140s. Lungs clear. Abdomen benign.   Two IVs established. She is not pregnant. Will try IV premarin and tranexamic acid. Initial hemoglobin is 7.6. BP currently ok but significant resting tachycardia, symptomatic and ongoing bleeding. Will transfuse PRBC. GYN consultation.   CRITICAL CARE Performed by: Virgel Manifold Total critical care time: 35 minutes Critical care time was exclusive of separately billable procedures and treating other patients. Critical care was necessary to treat or prevent imminent or life-threatening deterioration. Critical care was time spent personally by me on the following activities: development of treatment plan with patient and/or surrogate as well as nursing, discussions with consultants, evaluation of patient's response to treatment, examination of patient, obtaining history from patient or surrogate, ordering and performing treatments and interventions, ordering and review of laboratory studies, ordering and review of radiographic studies, pulse oximetry and re-evaluation of patient's condition.    Virgel Manifold, MD 12/28/18 1013

## 2018-12-26 ENCOUNTER — Other Ambulatory Visit: Payer: Self-pay

## 2018-12-26 ENCOUNTER — Encounter (HOSPITAL_COMMUNITY): Payer: Self-pay | Admitting: General Practice

## 2018-12-26 DIAGNOSIS — D649 Anemia, unspecified: Secondary | ICD-10-CM

## 2018-12-26 HISTORY — DX: Anemia, unspecified: D64.9

## 2018-12-26 LAB — CBC
HCT: 19.7 % — ABNORMAL LOW (ref 36.0–46.0)
HCT: 23.1 % — ABNORMAL LOW (ref 36.0–46.0)
Hemoglobin: 6.7 g/dL — CL (ref 12.0–15.0)
Hemoglobin: 8 g/dL — ABNORMAL LOW (ref 12.0–15.0)
MCH: 32.2 pg (ref 26.0–34.0)
MCH: 31.4 pg (ref 26.0–34.0)
MCHC: 34 g/dL (ref 30.0–36.0)
MCHC: 34.6 g/dL (ref 30.0–36.0)
MCV: 94.7 fL (ref 80.0–100.0)
MCV: 90.6 fL (ref 80.0–100.0)
Platelets: 217 10*3/uL (ref 150–400)
Platelets: 192 10*3/uL (ref 150–400)
RBC: 2.08 MIL/uL — ABNORMAL LOW (ref 3.87–5.11)
RBC: 2.55 MIL/uL — ABNORMAL LOW (ref 3.87–5.11)
RDW: 15.6 % — ABNORMAL HIGH (ref 11.5–15.5)
RDW: 15.6 % — ABNORMAL HIGH (ref 11.5–15.5)
WBC: 11.4 10*3/uL — ABNORMAL HIGH (ref 4.0–10.5)
WBC: 8.5 10*3/uL (ref 4.0–10.5)
nRBC: 0 % (ref 0.0–0.2)
nRBC: 0 % (ref 0.0–0.2)

## 2018-12-26 LAB — PREPARE RBC (CROSSMATCH)

## 2018-12-26 MED ORDER — ACETAMINOPHEN 325 MG PO TABS
650.0000 mg | ORAL_TABLET | Freq: Once | ORAL | Status: AC
  Administered 2018-12-26: 650 mg via ORAL
  Filled 2018-12-26: qty 2

## 2018-12-26 MED ORDER — SODIUM CHLORIDE 0.9% IV SOLUTION: Freq: Once | INTRAVENOUS | Status: DC

## 2018-12-26 MED ORDER — DIPHENHYDRAMINE HCL 25 MG PO CAPS
25.0000 mg | ORAL_CAPSULE | Freq: Once | ORAL | Status: AC
  Administered 2018-12-26: 25 mg via ORAL
  Filled 2018-12-26: qty 1

## 2018-12-26 MED ORDER — LEVOTHYROXINE SODIUM 100 MCG PO TABS
100.0000 ug | ORAL_TABLET | Freq: Every day | ORAL | Status: DC
  Administered 2018-12-26 – 2018-12-27 (×2): 100 ug via ORAL
  Filled 2018-12-26 (×2): qty 1

## 2018-12-26 MED ORDER — ACETAMINOPHEN 325 MG PO TABS
650.0000 mg | ORAL_TABLET | Freq: Once | ORAL | Status: AC
  Administered 2018-12-26: 21:00:00 650 mg via ORAL
  Filled 2018-12-26: qty 2

## 2018-12-26 NOTE — Progress Notes (Signed)
Paged MD on call second time. Tried to call the [facility] office, lead to same text page number. Still awaiting for call back.

## 2018-12-26 NOTE — Progress Notes (Signed)
Patient seen and examined.  Feeling better.  SOB and dizziness has improved.  Still some heart racing.  Bleeding has slowed, but still heavy with some clots.  Repeat cbc after 2 units PRBC 7.3 (prior was 7.9)  BP 126/63 (BP Location: Right Arm)   Pulse (!) 121   Temp 99.7 F (37.6 C) (Oral)   Resp 16   SpO2 100%   NAD Pelvic:  Pad with moderate heme  Lab Results  Component Value Date   WBC 10.5 12/25/2018   HGB 7.3 (L) 12/25/2018   HCT 21.7 (L) 12/25/2018   MCV 94.3 12/25/2018   PLT 235 12/25/2018    A/P:  g4p4 with ABLA secondary to HMB I suspect initial hemoglobin on admission of 7.9 had not yet equilibrated due to acute bleed.  Still with tachycardia, but improving, as are other symptoms.  Bleeding still heavy, though improved.  Will give one additional dose of IV premarin now.  Will hold additional blood products as symptoms have improved and repeat cbc in AM

## 2018-12-26 NOTE — Progress Notes (Signed)
CRITICAL VALUE ALERT  Critical Value:  Hemoglobin 6.7  Date & Time Notied:  12/26/18,  0518  Provider Notified: Jerelyn Charles  Orders Received/Actions taken: awaiting for orders

## 2018-12-26 NOTE — Progress Notes (Signed)
Pt changed pads x 5, moderately soiled throughout the night. Pt denies dizziness. Will monitor pt.

## 2018-12-26 NOTE — Progress Notes (Signed)
Patient is feeling better, dizziness has resolved, no complaints moving about the room.  She does report feeling heart pounding stronger.  She reports bleeding has lessened considerably.  Denies other problems.  Vitals:   12/25/18 2221 12/26/18 0205 12/26/18 0555 12/26/18 1009  BP: 126/63 122/66 120/61 119/71  Pulse: (!) 121 (!) 109 (!) 109 (!) 118  Resp: 16 16 16 16   Temp: 99.7 F (37.6 C) 99.6 F (37.6 C) 98.9 F (37.2 C) 98.9 F (37.2 C)  TempSrc: Oral Oral Oral Oral  SpO2: 100% 100% 100% 100%   Gen: NAD, pale Pelvic: light amount of bleeding on pad under patient changed >1 hr prior Ext: no calf tenderness  Lab Results  Component Value Date   WBC 11.4 (H) 12/26/2018   HGB 6.7 (LL) 12/26/2018   HCT 19.7 (L) 12/26/2018   MCV 94.7 12/26/2018   PLT 217 12/26/2018    --/--/O POS, O POS Performed at [facility] [address], [place],  [address]  (08/25 1136)  A/P HD#2 symptomatic anemia in a non-pregnant pt Hypothyroid- TSH recently increased to 157mcg daily Anemia- presented with Hb 7.6, per ER and on call doctor pt received 2U PRBCs, IV TXA and IV estrogen x 2.  Per oncall Dr. Carlis Abbott, she feels that given pt presenting with acute bleed, initial Hb was likely artificially elevated and did not reflect the true likely lower Hb.  4 hour post transfusion Hb 7.3, with repeat hb this am 6.7.  Discussed with patient option of add'l 2U transfusion given symptomatic but less so than yesterday.  Advised that if she is discharged home and has add'l heavy bleeding while awaiting further outpt management, she could get dangerously low.  Pt agrees to proceed with 2U PRBCs.  Communicated with RN and orders placed.  Will recheck Hb 4 hours post transfusion and evaluate bleeding status.  Will consider d/c home if bleeding minimal, pt asymptomatic and Hb improved.  Encouraged pt to wear SCDs while in bed, advised RNs to place.  Allyn Kenner

## 2018-12-26 NOTE — Progress Notes (Signed)
Pt's heart rate still running at 120's. Pt asymptomatic. Notified MD. New orders received

## 2018-12-26 NOTE — Progress Notes (Signed)
HD#2 Non -pregnant with heavy vaginal bleeding at admission.  Bleeding has tapered off but is still light.  Pt states she was feeling better this morning after 2U PRBCs and now feeling much better after additional 2U PRBCs.  No reporting CP/SOB or pounding heart feeling.  Vitals:   12/26/18 1147 12/26/18 1539 12/26/18 1600 12/26/18 1832  BP: 120/66 128/73 130/72 122/75  Pulse: (!) 113 (!) 115 (!) 114 97  Resp: 16 16 16 16   Temp: 98.9 F (37.2 C) 99 F (37.2 C) 99.1 F (37.3 C) 98.8 F (37.1 C)  TempSrc: Oral Oral Oral Oral  SpO2: 100% 99% 100% 100%   Gen: NAD, comfortable and smiling, not as pale   Lab Results  Component Value Date   WBC 11.4 (H) 12/26/2018   HGB 6.7 (LL) 12/26/2018   HCT 19.7 (L) 12/26/2018   MCV 94.7 12/26/2018   PLT 217 12/26/2018    --/--/O POS, O POS Performed at [facility] [address] [place], [place] [address]  (08/25 1136)  A/P HD #2 symptomatic anemia Now s/p 4U PRBCs- due for repeat H/H approx 10pm Discussed outpt mgmt option and pt will consider Will plan to send pt home on megace taper and iron bid with close f/u Will continue to monitor overnight and if stable with improved H/H tomorrow morning will consider d/c home.  Routine care.    Allyn Kenner

## 2018-12-27 LAB — TYPE AND SCREEN
ABO/RH(D): O POS
Antibody Screen: NEGATIVE
Unit division: 0
Unit division: 0
Unit division: 0
Unit division: 0

## 2018-12-27 LAB — BPAM RBC
Blood Product Expiration Date: 202009182359
Blood Product Expiration Date: 202009182359
Blood Product Expiration Date: 202009232359
Blood Product Expiration Date: 202009242359
ISSUE DATE / TIME: 202008251346
ISSUE DATE / TIME: 202008251611
ISSUE DATE / TIME: 202008261127
ISSUE DATE / TIME: 202008261540
Unit Type and Rh: 5100
Unit Type and Rh: 5100
Unit Type and Rh: 5100
Unit Type and Rh: 5100

## 2018-12-27 LAB — CBC
HCT: 23.3 % — ABNORMAL LOW (ref 36.0–46.0)
Hemoglobin: 7.8 g/dL — ABNORMAL LOW (ref 12.0–15.0)
MCH: 31 pg (ref 26.0–34.0)
MCHC: 33.5 g/dL (ref 30.0–36.0)
MCV: 92.5 fL (ref 80.0–100.0)
Platelets: 186 10*3/uL (ref 150–400)
RBC: 2.52 MIL/uL — ABNORMAL LOW (ref 3.87–5.11)
RDW: 15.9 % — ABNORMAL HIGH (ref 11.5–15.5)
WBC: 8.1 10*3/uL (ref 4.0–10.5)
nRBC: 0 % (ref 0.0–0.2)

## 2018-12-27 MED ORDER — MEGESTROL ACETATE 40 MG PO TABS: 40.0000 mg | ORAL_TABLET | Freq: Every day | ORAL | 0 refills | Status: AC

## 2018-12-27 MED ORDER — FERROUS GLUCONATE 324 (38 FE) MG PO TABS: 324.0000 mg | ORAL_TABLET | Freq: Three times a day (TID) | ORAL | 3 refills | Status: AC

## 2018-12-27 NOTE — Discharge Summary (Signed)
Pt presented to ER with heavy vaginal bleeding.  Found to be severely anemic and symptomatic.  ER physicians administered IV estrogen, IV TXA, and 2U PRBCs. Pt received a second dose of IV estrogen once admitted.  Bleeding improved but did not resolved completely. By HD #2 pt was feeling much better but still tachycardic with sensation of heart pounding and Hb 6.7.  Consent obtained to administer additional 2U PRBCs.  I did not feel IV estrogen was suitable and advised pt I would like to try Megace taper with plan to create more longterm mgmt plan. We discussed outpt mgmt options.  Pt would like to consider further and will f/u in office next Wed to establish plan.  Advised to start iron tid and megace taper.  Both Rx sent to pharmacy.  Advised pt if she has add'l heavy bleeding to to office visit, to proceed to ER for evaluation.  Hb nadir was 6.7 after 2U, after 2 add'l units improved to 8.0 and stabilized at 7.8.  Bleeding was light.  Pt denied CP/SOB of further heavy bleeding on day of discharge and was comfortable with plan to discharge home and f/u in office.

## 2018-12-27 NOTE — Progress Notes (Signed)
Patient given discharge instructions and verbalizes understanding. Patient with no complaints of pain. Will take out via [facility] when spouse arrives.

## 2020-07-06 ENCOUNTER — Other Ambulatory Visit: Payer: Self-pay | Admitting: Internal Medicine

## 2020-07-06 DIAGNOSIS — E890 Postprocedural hypothyroidism: Secondary | ICD-10-CM

## 2020-07-06 DIAGNOSIS — Z85850 Personal history of malignant neoplasm of thyroid: Secondary | ICD-10-CM

## 2020-07-13 ENCOUNTER — Other Ambulatory Visit: Payer: Self-pay | Admitting: Internal Medicine

## 2020-07-13 ENCOUNTER — Other Ambulatory Visit: Payer: Self-pay

## 2020-07-13 ENCOUNTER — Ambulatory Visit
Admission: RE | Admit: 2020-07-13 | Discharge: 2020-07-13 | Disposition: A | Discharge status: Home / Self Care | Payer: 59 | Source: Ambulatory Visit | Attending: Internal Medicine | Admitting: Internal Medicine

## 2020-07-13 DIAGNOSIS — Z85850 Personal history of malignant neoplasm of thyroid: Secondary | ICD-10-CM

## 2020-07-13 DIAGNOSIS — E890 Postprocedural hypothyroidism: Secondary | ICD-10-CM

## 2023-06-05 NOTE — Progress Notes (Signed)
 43 y.o. female returns for follow up. She was last seen on 11/30/2022.   She has a history of thyroid  cancer and has post-surgical hypothyroidism. She underwent a total thyroidectomy on 08/13/2013. Pathology report indicated multifocal papillary thyroid  carcinoma (classical variant) with 3 foci of tumor localized to the left lobe, measuring 17 mm, 5 mm and 2 mm. Margins were uninvolved and there was no lymphovascular invasion. Background of Hashimoto's thyroiditis and nodular hyperplasia was present. One perithyroidal lymph node was negative. No parathyroid glands were seen. On 09/04/13 she was given 40 mCi I-131. No post-ablative whole body scan was done. Routine follow up has showed thyroglobulin and anti-thyroglobulin antibodies are undetectable. Neck US  on 07/13/2020 at [facility] was unremarkable.   She is compliant with her levothyroxine . No missed doses. She has been doing well overall. No acute complaints.   PAST MEDICAL HISTORY:  Papillary thyroid  carcinoma Post-op hypothyroidism Post-op hypoparathyroidism, resolved.  PAST SURGICAL HISTORY:  Total thyroidectomy, 08/13/2013.   Outpatient Medications Marked as Taking for the 06/05/23 encounter (Office Visit) with Solum, Anna Melissa, MD  Medication Sig Dispense Refill  . azelastine (ASTELIN) 137 mcg nasal spray Place 2 sprays into both nostrils 2 (two) times daily    . citalopram (CELEXA) 20 MG tablet take 1 tablet by mouth every day 30 tablet 5  . fluticasone propionate (FLONASE) 50 mcg/actuation nasal spray Place 2 sprays into both nostrils once daily    . hydrocortisone (ANUSOL-HC) 25 mg suppository PLACE 1 SUPPOSITORY (25 MG TOTAL) RECTALLY ONCE DAILY AS NEEDED FOR HEMORRHOIDS. 20 suppository 0  . levocetirizine (XYZAL) 5 MG tablet Take 5 mg by mouth every evening    . levonorgestreL (MIRENA 52 MG) 20 mcg/24 hr (5 years) IUD Insert 1 each into the uterus once Follow package directions.    . levothyroxine  (SYNTHROID ) 112 MCG tablet TAKE ONE  TABLET SIX DAYS PER WEEK. TAKE ONE AND ONE-HALF TABLET ON SUNDAYS. 32 tablet 5  . multivitamin tablet Take 1 tablet by mouth once daily     Exam BP 112/88   Pulse 73   Ht 167.6 cm (5' 6)   Wt 93.4 kg (205 lb 12.8 oz)   SpO2 99%   BMI 33.22 kg/m   GEN: well developed female in NAD. HEENT: No proptosis. No lid lag or stare.  NECK: supple, trachea midline. No neck mass. No palpable nodules.  NEURO: no tremor of outstretched hands LYMPH: no anterior or posterior cervical, submandibular or supraclavicular LAD. PSYC: alert and oriented, good insight.   Labs  Ancillary Orders on 05/29/2023  Component Date Value Ref Range Status  . Thyroblobuilin Antibody - [facility] 05/29/2023 <1.0  0.0 - 0.9 IU/mL Final   Thyroglobulin Antibody measured by [employer] Methodology It should be noted that the presence of thyroglobulin antibodies may not be pathogenic nor diagnostic, especially at very low levels. The assay manufacturer has found that four percent of individuals without evidence of thyroid  disease or autoimmunity will have positive TgAb levels up to 4 IU/mL.  . Thyroid  Stimulating Hormone (TSH) 05/29/2023 0.678  0.450-5.330 uIU/ml uIU/mL Final   Reference Range for Pregnant Females >= 18 yrs old: Normal Range for 1st trimester: 0.05-3.70 ulU/ml Normal Range for 2nd trimester: 0.31-4.35 ulU/ml  . Thyroglobulin by IMA   -[facility] 05/29/2023 <0.1 (L)  1.5 - 38.5 ng/mL Final   According to the [facility] of Clinical Biochemistry, the reference interval for Thyroglobulin (TG) should be related to euthyroid patients and not for patients who underwent thyroidectomy. TG  reference intervals for these patients depend on the residual mass of the thyroid  tissue left after surgery. Establishing a post-operative baseline is recommended. The assay limit of quantitation is 0.1 ng/mL Thyroglobulin measured by [employer] Coulter Immunometric Assay    Assessment 1. Post-surgical hypothyroidism    2. History of thyroid  cancer     Plan   - Lashone is now nearly 10 yrs from diagnosis of multifocal papillary thyroid  cancer with no findings to suggest recurrence. Her TSH is in good range. No role for surveillance imaging at this time.  - Continue levothyroxine  as she is doing.  - Return for follow up in 6 months or sooner if needed, with labs prior.

## 2023-11-27 ENCOUNTER — Emergency Department
Admission: EM | Admit: 2023-11-27 | Discharge: 2023-11-27 | Disposition: A | Discharge status: Home / Self Care | Attending: Emergency Medicine | Admitting: Emergency Medicine

## 2023-11-27 ENCOUNTER — Other Ambulatory Visit: Payer: Self-pay

## 2023-11-27 ENCOUNTER — Encounter: Payer: Self-pay | Admitting: Emergency Medicine

## 2023-11-27 DIAGNOSIS — R42 Dizziness and giddiness: Secondary | ICD-10-CM | POA: Diagnosis present

## 2023-11-27 DIAGNOSIS — I10 Essential (primary) hypertension: Secondary | ICD-10-CM | POA: Insufficient documentation

## 2023-11-27 LAB — CBC WITH DIFFERENTIAL/PLATELET
Abs Immature Granulocytes: 0.04 K/uL (ref 0.00–0.07)
Basophils Absolute: 0.1 K/uL (ref 0.0–0.1)
Basophils Relative: 1 %
Eosinophils Absolute: 0.7 K/uL — ABNORMAL HIGH (ref 0.0–0.5)
Eosinophils Relative: 8 %
HCT: 41.8 % (ref 36.0–46.0)
Hemoglobin: 14.6 g/dL (ref 12.0–15.0)
Immature Granulocytes: 0 %
Lymphocytes Relative: 29 %
Lymphs Abs: 2.8 K/uL (ref 0.7–4.0)
MCH: 31.9 pg (ref 26.0–34.0)
MCHC: 34.9 g/dL (ref 30.0–36.0)
MCV: 91.5 fL (ref 80.0–100.0)
Monocytes Absolute: 0.5 K/uL (ref 0.1–1.0)
Monocytes Relative: 6 %
Neutro Abs: 5.5 K/uL (ref 1.7–7.7)
Neutrophils Relative %: 56 %
Platelets: 388 K/uL (ref 150–400)
RBC: 4.57 MIL/uL (ref 3.87–5.11)
RDW: 13.2 % (ref 11.5–15.5)
WBC: 9.7 K/uL (ref 4.0–10.5)
nRBC: 0 % (ref 0.0–0.2)

## 2023-11-27 LAB — COMPREHENSIVE METABOLIC PANEL WITH GFR
ALT: 24 U/L (ref 0–44)
AST: 25 U/L (ref 15–41)
Albumin: 4 g/dL (ref 3.5–5.0)
Alkaline Phosphatase: 75 U/L (ref 38–126)
Anion gap: 10 (ref 5–15)
BUN: 9 mg/dL (ref 6–20)
CO2: 26 mmol/L (ref 22–32)
Calcium: 9.2 mg/dL (ref 8.9–10.3)
Chloride: 106 mmol/L (ref 98–111)
Creatinine, Ser: 0.83 mg/dL (ref 0.44–1.00)
GFR, Estimated: >60 mL/min (ref >60)
Glucose, Bld: 98 mg/dL (ref 70–99)
Potassium: 4.4 mmol/L (ref 3.5–5.1)
Sodium: 142 mmol/L (ref 135–145)
Total Bilirubin: 0.3 mg/dL (ref 0.0–1.2)
Total Protein: 7.4 g/dL (ref 6.5–8.1)

## 2023-11-27 NOTE — ED Provider Triage Note (Signed)
 Emergency Medicine Provider Triage Evaluation Note  Melanie [name] , a 43 y.o. female  was evaluated in triage.  Pt complains of headache.  Patient states after lunch today she was having dizziness, blurry vision, she checked her blood pressure and it was elevated.  Patient is not taking any blood pressure medications and denies chest pain patient states headache is a throbbing headache in the occipital area.  Patient is not taking any diuretics or blood thinners..  Review of Systems  Positive:  Negative:   Physical Exam  BP (!) 150/100   Pulse 90   Temp 99.1 F (37.3 C) (Oral)   Resp 17   Ht 5' 6 (1.676 m)   Wt 93 kg   SpO2 99%   BMI 33.09 kg/m  Gen:   Awake, no distress during triage patient was hypertensive Resp:  Normal effort MSK:   Moves extremities without difficulty  Other:    Medical Decision Making  Medically screening exam initiated at 4:39 PM.  Appropriate orders placed.  Melanie [name] was informed that the remainder of the evaluation will be completed by another provider, this initial triage assessment does not replace that evaluation, and the importance of remaining in the ED until their evaluation is complete.  Patient who presents today with acute onset of dizziness of blurry vision, high blood pressure patient is known hypertensive she is not taking medications.  Ordered CBC CMP.   Janit Kast, PA-C 11/27/23 1640

## 2023-11-27 NOTE — ED Provider Notes (Signed)
 [facility] Provider Note    Event Date/Time   First MD Initiated Contact with Patient 11/27/23 1654     (approximate)   History   Hypertension   HPI  Melanie [name] is a 43 y.o. female presents from [facility] with a past medical history of GERD, anemia, thyroidectomy presents to the emergency department with dizziness and elevated blood pressure reading that started today.  Patient states she was at work when she started to feel lightheaded and dizzy; coworker took her blood pressure and it was elevated.  Patient did go to [facility] and was sent here for hypertensive emergency; BP reading there was 170/112. Patient denies chest pain, shortness of breath, nausea, vomiting, diarrhea, abdominal pain, hitting her head, fall or injury, extremity weakness, fever, urinary symptoms. No recent illness. Denies known history of hypertension. Does not currently take any blood pressure medications.  No blood thinners.     Physical Exam   Triage Vital Signs: ED Triage Vitals [11/27/23 1636]  Encounter Vitals Group     BP (!) 150/100     Girls Systolic BP Percentile      Girls Diastolic BP Percentile      Boys Systolic BP Percentile      Boys Diastolic BP Percentile      Pulse Rate 90     Resp 17     Temp 99.1 F (37.3 C)     Temp Source Oral     SpO2 99 %     Weight 205 lb (93 kg)     Height 5' 6 (1.676 m)     Head Circumference      Peak Flow      Pain Score 0     Pain Loc      Pain Education      Exclude from Growth Chart     Most recent vital signs: Vitals:   11/27/23 1636 11/27/23 1730  BP: (!) 150/100 (!) 141/98  Pulse: 90 88  Resp: 17   Temp: 99.1 F (37.3 C)   SpO2: 99% 99%    General: Awake, in no acute distress. Appears stated age. Head: Normocephalic, atraumatic. Eyes: PERRLA. EOMs intact. No scleral icterus or conjunctival injection. Ears/Nose/Throat: TMs intact b/l. Nares patent, no nasal discharge.  Neck: Supple, no  lymphadenopathy, no nuchal rigidity. No thyromegaly. CV: Regular rate, 88 bpm. Peripheral pulses 2+ and symmetric. No edema. Respiratory: Breath sounds clear b/l. No wheezes, rales, or rhonchi. No respiratory distress. Normal respiratory effort. GI: Soft, non-distended. MSK: Normal ROM and 5/5 strength in b/l upper and lower extremities. No midline spinal tenderness. Ambulatory with normal gait. Skin:Warm, dry, intact. No rashes, lesions, or ecchymosis. No cyanosis or pallor. Neurological: A&Ox4 to person, place, time, and situation. Cranial nerves II-XII grossly intact. Sensation intact. Strength symmetric. No focal deficits.  No CVA tenderness b/l.  ED Results / Procedures / Treatments   Labs (all labs ordered are listed, but only abnormal results are displayed) Labs Reviewed  CBC WITH DIFFERENTIAL/PLATELET - Abnormal; Notable for the following components:      Result Value   Eosinophils Absolute 0.7 (*)    All other components within normal limits  COMPREHENSIVE METABOLIC PANEL WITH GFR     EKG  Rate: 87 bpm Rhythm: regular, NSR Axis: normal P Waves: present before every QRS PR Interval: 142 ms  QRS Complex: 84 ms ST Segment: isometric QT interval: 370 ms T Waves: upright No evidence of LVH, LBBB, STEMI.  RADIOLOGY    PROCEDURES:  Critical Care performed: No   Procedures   MEDICATIONS ORDERED IN ED: Medications - No data to display   IMPRESSION / MDM / ASSESSMENT AND PLAN / ED COURSE  I reviewed the triage vital signs and the nursing notes.                              Differential diagnosis includes, but is not limited to, hypertension, hypertensive urgency vs. emergency, ACS, thyroid  dysfunction, anemia, electrolyte abnormality  Patient's presentation is most consistent with acute presentation with potential threat to life or bodily function.  Patient is a 43 year old female who presented today with intermittent dizziness and elevated blood pressure  reading.  CMP, CBC, TSH reassuring.  EKG shows normal sinus rhythm.  Physical exam reassuring.  Blood pressure readings here were 150/100, repeat shows 141/98.  Reviewed note from [facility] clinic where patient was seen today; her blood pressure has significantly reduced since her reading there of 170/112.  We discussed following up with her primary care provider to start antihypertensives.  Also encouraged her to purchase a blood pressure cuff to take her blood pressure at home and keep a journal prior to meeting with her primary care provider.  They can follow-up in the emergency department for any new, concerning, or worsening symptoms.  Patient was given the opportunity to ask questions; all questions were answered. Emergency department return precautions were discussed with the patient.  Patient is in agreement to the treatment plan.  Patient is stable for discharge.    FINAL CLINICAL IMPRESSION(S) / ED DIAGNOSES   Final diagnoses:  Hypertension, unspecified type     Rx / DC Orders   ED Discharge Orders     None        Note:  This document was prepared using Dragon voice recognition software and may include unintentional dictation errors.     Sheron Salm, PA-C 11/27/23 1819    Arlander Charleston, MD 11/27/23 [phone]

## 2023-11-27 NOTE — Discharge Instructions (Addendum)
 As we discussed, though you do have high blood pressure (hypertension), fortunately it is not immediately dangerous at this time and does not need emergency intervention or admission to the hospital.  I would like you to follow up with your primary care doctor to evaluate you in clinic and decide if any medications are needed.    Return to the Emergency Department (ED) if you experience any worsening chest pain/pressure/tightness, difficulty breathing, or sudden sweating, or other symptoms that concern you.

## 2023-11-27 NOTE — ED Triage Notes (Signed)
 Patient to ED via POV for hypertension. PT states 177/100 after lunch. Pt checked it due to feeling dizzy and trouble focusing her vision today. States her vision is not blurred though. Denies HTN hx and does not take meds for same.
